# Patient Record
Sex: Male | Born: 1975 | Race: Black or African American | Hispanic: No | Marital: Married | Smoking: Former smoker
Health system: Southern US, Community
[De-identification: ages and names within clinical notes are randomized; demographics above are authoritative.]

## PROBLEM LIST (undated history)

## (undated) DIAGNOSIS — E079 Disorder of thyroid, unspecified: Secondary | ICD-10-CM

## (undated) HISTORY — DX: Disorder of thyroid, unspecified: E07.9

## (undated) HISTORY — PX: CYST EXCISION: SHX5701

---

## 2011-10-13 ENCOUNTER — Ambulatory Visit (INDEPENDENT_AMBULATORY_CARE_PROVIDER_SITE_OTHER): Payer: Managed Care, Other (non HMO) | Admitting: Internal Medicine

## 2011-10-13 ENCOUNTER — Ambulatory Visit: Payer: Managed Care, Other (non HMO)

## 2011-10-13 VITALS — BP 117/75 | HR 78 | Temp 98.4°F | Resp 16 | Ht 67.0 in | Wt 156.0 lb

## 2011-10-13 DIAGNOSIS — Z0000 Encounter for general adult medical examination without abnormal findings: Secondary | ICD-10-CM

## 2011-10-13 DIAGNOSIS — E559 Vitamin D deficiency, unspecified: Secondary | ICD-10-CM

## 2011-10-13 DIAGNOSIS — J40 Bronchitis, not specified as acute or chronic: Secondary | ICD-10-CM

## 2011-10-13 DIAGNOSIS — F17200 Nicotine dependence, unspecified, uncomplicated: Secondary | ICD-10-CM

## 2011-10-13 LAB — POCT CBC
HCT, POC: 44.6 % (ref 43.5–53.7)
Hemoglobin: 14.1 g/dL (ref 14.1–18.1)
Lymph, poc: 4.8 — AB (ref 0.6–3.4)
MCH, POC: 28.3 pg (ref 27–31.2)
MCHC: 31.6 g/dL — AB (ref 31.8–35.4)
MPV: 9.6 fL (ref 0–99.8)
POC MID %: 8.3 %M (ref 0–12)
RBC: 4.98 M/uL (ref 4.69–6.13)
WBC: 9.7 10*3/uL (ref 4.6–10.2)

## 2011-10-13 LAB — POCT URINALYSIS DIPSTICK
Blood, UA: NEGATIVE
Nitrite, UA: NEGATIVE
Protein, UA: NEGATIVE
Spec Grav, UA: 1.015
Urobilinogen, UA: 0.2
pH, UA: 5.5

## 2011-10-13 LAB — IFOBT (OCCULT BLOOD): IFOBT: POSITIVE

## 2011-10-13 LAB — POCT UA - MICROSCOPIC ONLY
Casts, Ur, LPF, POC: NEGATIVE
Crystals, Ur, HPF, POC: NEGATIVE

## 2011-10-13 MED ORDER — AZITHROMYCIN 500 MG PO TABS: 500.0000 mg | ORAL_TABLET | Freq: Every day | ORAL | Status: AC

## 2011-10-13 MED ORDER — HYDROCODONE-ACETAMINOPHEN 7.5-500 MG/15ML PO SOLN: 5.0000 mL | Freq: Four times a day (QID) | ORAL | Status: AC | PRN

## 2011-10-13 NOTE — Patient Instructions (Addendum)
Nicotine Addiction Nicotine can act as both a stimulant (excites/activates) and a sedative (calms/quiets). Immediately after exposure to nicotine, there is a "kick" caused in part by the drug's stimulation of the adrenal glands and resulting discharge of adrenaline (epinephrine). The rush of adrenaline stimulates the body and causes a sudden release of sugar. This means that smokers are always slightly hyperglycemic. Hyperglycemic means that the blood sugar is high, just like in diabetics. Nicotine also decreases the amount of insulin which helps control sugar levels in the body. There is an increase in blood pressure, breathing, and the rate of heart beats.  In addition, nicotine indirectly causes a release of dopamine in the brain that controls pleasure and motivation. A similar reaction is seen with other drugs of abuse, such as cocaine and heroin. This dopamine release is thought to cause the pleasurable sensations when smoking. In some different cases, nicotine can also create a calming effect, depending on sensitivity of the smoker's nervous system and the dose of nicotine taken. WHAT HAPPENS WHEN NICOTINE IS TAKEN FOR LONG PERIODS OF TIME?  Long-term use of nicotine results in addiction. It is difficult to stop.   Repeated use of nicotine creates tolerance. Higher doses of nicotine are needed to get the "kick."  When nicotine use is stopped, withdrawal may last a month or more. Withdrawal may begin within a few hours after the last cigarette. Symptoms peak within the first few days and may lessen within a few weeks. For some people, however, symptoms may last for months or longer. Withdrawal symptoms include:   Irritability.   Craving.   Learning and attention deficits.   Sleep disturbances.   Increased appetite.  Craving for tobacco may last for 6 months or longer. Many behaviors done while using nicotine can also play a part in the severity of withdrawal symptoms. For some people, the  feel, smell, and sight of a cigarette and the ritual of obtaining, handling, lighting, and smoking the cigarette are closely linked with the pleasure of smoking. When stopped, they also miss the related behaviors which make the withdrawal or craving worse. While nicotine gum and patches may lessen the drug aspects of withdrawal, cravings often persist. WHAT ARE THE MEDICAL CONSEQUENCES OF NICOTINE USE?  Nicotine addiction accounts for one-third of all cancers. The top cancer caused by tobacco is lung cancer. Lung cancer is the number one cancer killer of both men and women.   Smoking is also associated with cancers of the:   Mouth.   Pharynx.   Larynx.   Esophagus.   Stomach.   Pancreas.   Cervix.   Kidney.   Ureter.   Bladder.   Smoking also causes lung diseases such as lasting (chronic) bronchitis and emphysema.   It worsens asthma in adults and children.   Smoking increases the risk of heart disease, including:   Stroke.   Heart attack.   Vascular disease.   Aneurysm.   Passive or secondary smoke can also increase medical risks including:   Asthma in children.   Sudden Infant Death Syndrome (SIDS).   Additionally, dropped cigarettes are the leading cause of residential fire fatalities.   Nicotine poisoning has been reported from accidental ingestion of tobacco products by children and pets. Death usually results in a few minutes from respiratory failure (when a person stops breathing) caused by paralysis.  TREATMENT   Medication. Nicotine replacement medicines such as nicotine gum and the patch are used to stop smoking. These medicines gradually lower the dosage   of nicotine in the body. These medicines do not contain the carbon monoxide and other toxins found in tobacco smoke.   Hypnotherapy.   Relaxation therapy.   Nicotine Anonymous (a 12-step support program). Find times and locations in your local yellow pages.  Document Released: 12/18/2003 Document  Revised: 04/02/2011 Document Reviewed: 05/11/2007 [facility] Patient Information 2012 [place], [place].Bronchitis Bronchitis is the body's way of reacting to injury and/or infection (inflammation) of the bronchi. Bronchi are the air tubes that extend from the windpipe into the lungs. If the inflammation becomes severe, it may cause shortness of breath. CAUSES  Inflammation may be caused by:  A virus.   Germs (bacteria).   Dust.   Allergens.   Pollutants and many other irritants.  The cells lining the bronchial tree are covered with tiny hairs (cilia). These constantly beat upward, away from the lungs, toward the mouth. This keeps the lungs free of pollutants. When these cells become too irritated and are unable to do their job, mucus begins to develop. This causes the characteristic cough of bronchitis. The cough clears the lungs when the cilia are unable to do their job. Without either of these protective mechanisms, the mucus would settle in the lungs. Then you would develop pneumonia. Smoking is a common cause of bronchitis and can contribute to pneumonia. Stopping this habit is the single most important thing you can do to help yourself. TREATMENT   Your caregiver may prescribe an antibiotic if the cough is caused by bacteria. Also, medicines that open up your airways make it easier to breathe. Your caregiver may also recommend or prescribe an expectorant. It will loosen the mucus to be coughed up. Only take over-the-counter or prescription medicines for pain, discomfort, or fever as directed by your caregiver.   Removing whatever causes the problem (smoking, for example) is critical to preventing the problem from getting worse.   Cough suppressants may be prescribed for relief of cough symptoms.   Inhaled medicines may be prescribed to help with symptoms now and to help prevent problems from returning.   For those with recurrent (chronic) bronchitis, there may be a need for steroid  medicines.  SEEK IMMEDIATE MEDICAL CARE IF:   During treatment, you develop more pus-like mucus (purulent sputum).   You have a fever.   Your baby is older than 3 months with a rectal temperature of 102 F (38.9 C) or higher.   Your baby is 55 months old or younger with a rectal temperature of 100.4 F (38 C) or higher.   You become progressively more ill.   You have increased difficulty breathing, wheezing, or shortness of breath.  It is necessary to seek immediate medical care if you are elderly or sick from any other disease. MAKE SURE YOU:   Understand these instructions.   Will watch your condition.   Will get help right away if you are not doing well or get worse.  Document Released: 04/13/2005 Document Revised: 04/02/2011 Document Reviewed: 02/21/2008 [facility] Patient Information 2012 [place], [place].

## 2011-10-13 NOTE — Progress Notes (Signed)
Subjective:    Patient ID: Jonathon Harris, male    DOB: [birth year], 36 y.o.   MRN: [id number]  HPI Has bronchitis, wants a cpe, smoker with fhx of cad, has 2 skin problems. See scanned hx   Review of Systems See scanned ros    Objective:   Physical Exam  Constitutional: He is oriented to person, place, and time. He appears well-developed and well-nourished.  HENT:  Right Ear: External ear normal.  Left Ear: External ear normal.  Nose: Nose normal.  Mouth/Throat: Oropharynx is clear and moist.  Eyes: Conjunctivae and EOM are normal. Pupils are equal, round, and reactive to light.  Neck: Normal range of motion. Neck supple. No thyromegaly present.  Cardiovascular: Normal rate, regular rhythm and normal heart sounds.   Pulmonary/Chest: Effort normal. Not tachypneic. No respiratory distress. He has no decreased breath sounds. He has rhonchi.  Abdominal: Soft. Bowel sounds are normal. He exhibits no mass. There is no tenderness.  Genitourinary: Rectum normal, prostate normal and penis normal.  Musculoskeletal: Normal range of motion.  Lymphadenopathy:    He has no cervical adenopathy.  Neurological: He is alert and oriented to person, place, and time. He has normal reflexes. He displays normal reflexes. No cranial nerve deficit. He exhibits normal muscle tone. Coordination normal.  Skin: Skin is warm and dry.  Psychiatric: He has a normal mood and affect. His behavior is normal. Judgment and thought content normal.   [facility] reading (PRIMARY) by  Dr.Jaymarion Trombly cxr nl.  ekg nl Results for orders placed in visit on 10/13/11  POCT URINALYSIS DIPSTICK      Component Value Range   Color, UA yellow     Clarity, UA clear     Glucose, UA neg     Bilirubin, UA neg     Ketones, UA neg     Spec Grav, UA 1.015     Blood, UA neg     pH, UA 5.5     Protein, UA neg     Urobilinogen, UA 0.2     Nitrite, UA neg     Leukocytes, UA Negative    POCT UA - MICROSCOPIC ONLY      Component Value Range    WBC, Ur, HPF, POC 1-3     RBC, urine, microscopic 0-2     Bacteria, U Microscopic small     Mucus, UA neg     Epithelial cells, urine per micros 1-3     Crystals, Ur, HPF, POC neg     Casts, Ur, LPF, POC neg     Yeast, UA neg    POCT CBC      Component Value Range   WBC 9.7  4.6 - 10.2 K/uL   Lymph, poc 4.8 (*) 0.6 - 3.4   POC LYMPH PERCENT 49.1  10 - 50 %L   MID (cbc) 0.8  0 - 0.9   POC MID % 8.3  0 - 12 %M   POC Granulocyte 4.1  2 - 6.9   Granulocyte percent 42.6  37 - 80 %G   RBC 4.98  4.69 - 6.13 M/uL   Hemoglobin 14.1  14.1 - 18.1 g/dL   HCT, POC 40.9  81.1 - 53.7 %   MCV 89.5  80 - 97 fL   MCH, POC 28.3  27 - 31.2 pg   MCHC 31.6 (*) 31.8 - 35.4 g/dL   RDW, POC 91.4     Platelet Count, POC 206  142 - 424 K/uL  MPV 9.6  0 - 99.8 fL  IFOBT (OCCULT BLOOD)      Component Value Range   IFOBT Positive            Assessment & Plan:  See Dr. Jorja Loa Quit smoking Bronchitis

## 2011-10-14 LAB — COMPREHENSIVE METABOLIC PANEL
ALT: 11 U/L (ref 0–53)
AST: 13 U/L (ref 0–37)
Albumin: 4.1 g/dL (ref 3.5–5.2)
BUN: 9 mg/dL (ref 6–23)
CO2: 27 mEq/L (ref 19–32)
Calcium: 8.9 mg/dL (ref 8.4–10.5)
Chloride: 108 mEq/L (ref 96–112)
Creat: 0.93 mg/dL (ref 0.50–1.35)
Potassium: 4.3 mEq/L (ref 3.5–5.3)

## 2011-10-14 LAB — LIPID PANEL: LDL Cholesterol: 99 mg/dL (ref 0–99)

## 2011-10-15 LAB — TSH: TSH: 0.249 u[IU]/mL — ABNORMAL LOW (ref 0.350–4.500)

## 2011-10-15 LAB — VITAMIN D 25 HYDROXY (VIT D DEFICIENCY, FRACTURES): Vit D, 25-Hydroxy: 15 ng/mL — ABNORMAL LOW (ref 30–89)

## 2011-10-17 ENCOUNTER — Encounter: Payer: Self-pay | Admitting: Family Medicine

## 2011-10-21 ENCOUNTER — Encounter: Payer: Self-pay | Admitting: Internal Medicine

## 2013-09-08 IMAGING — CR DG CHEST 2V
2 series · 2 of 2 positions shown · non-contrast
Comparison: Preliminary reading of Dr. Saddler.

CLINICAL DATA: Chest pain

CHEST - 2 VIEW

[PA]
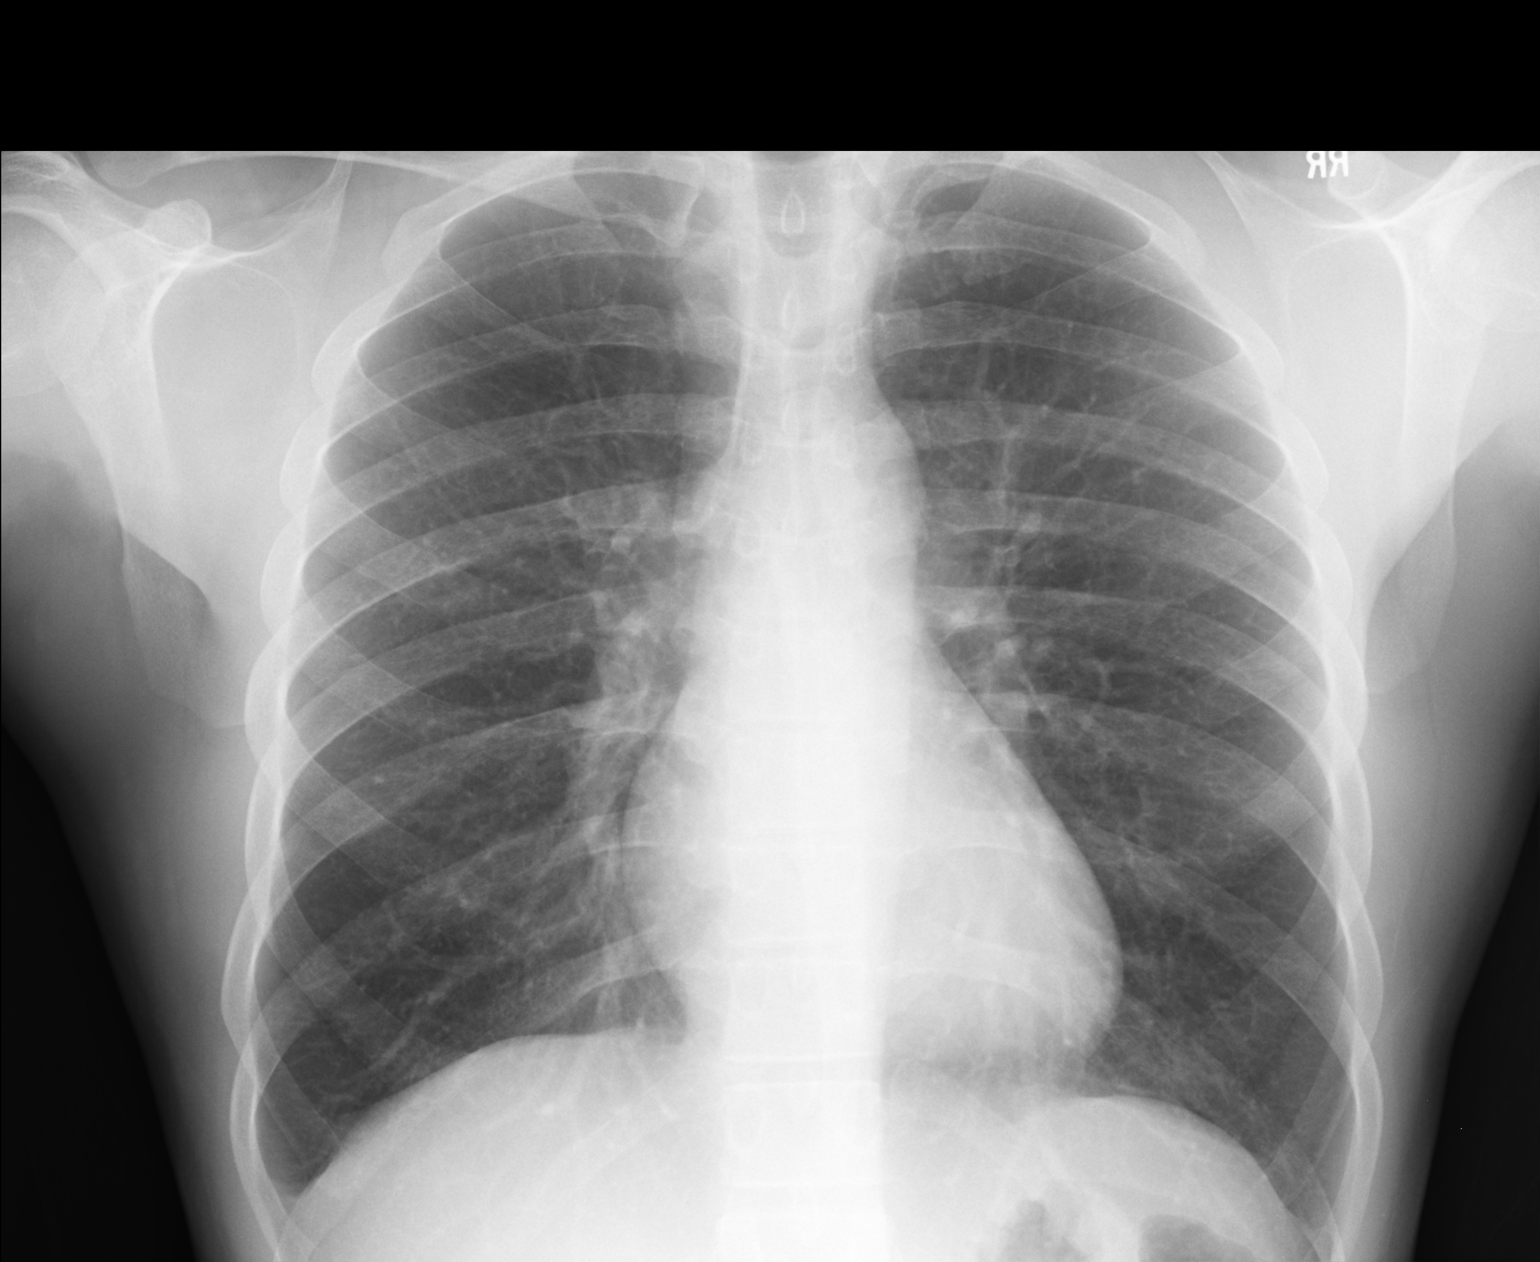

[lateral]
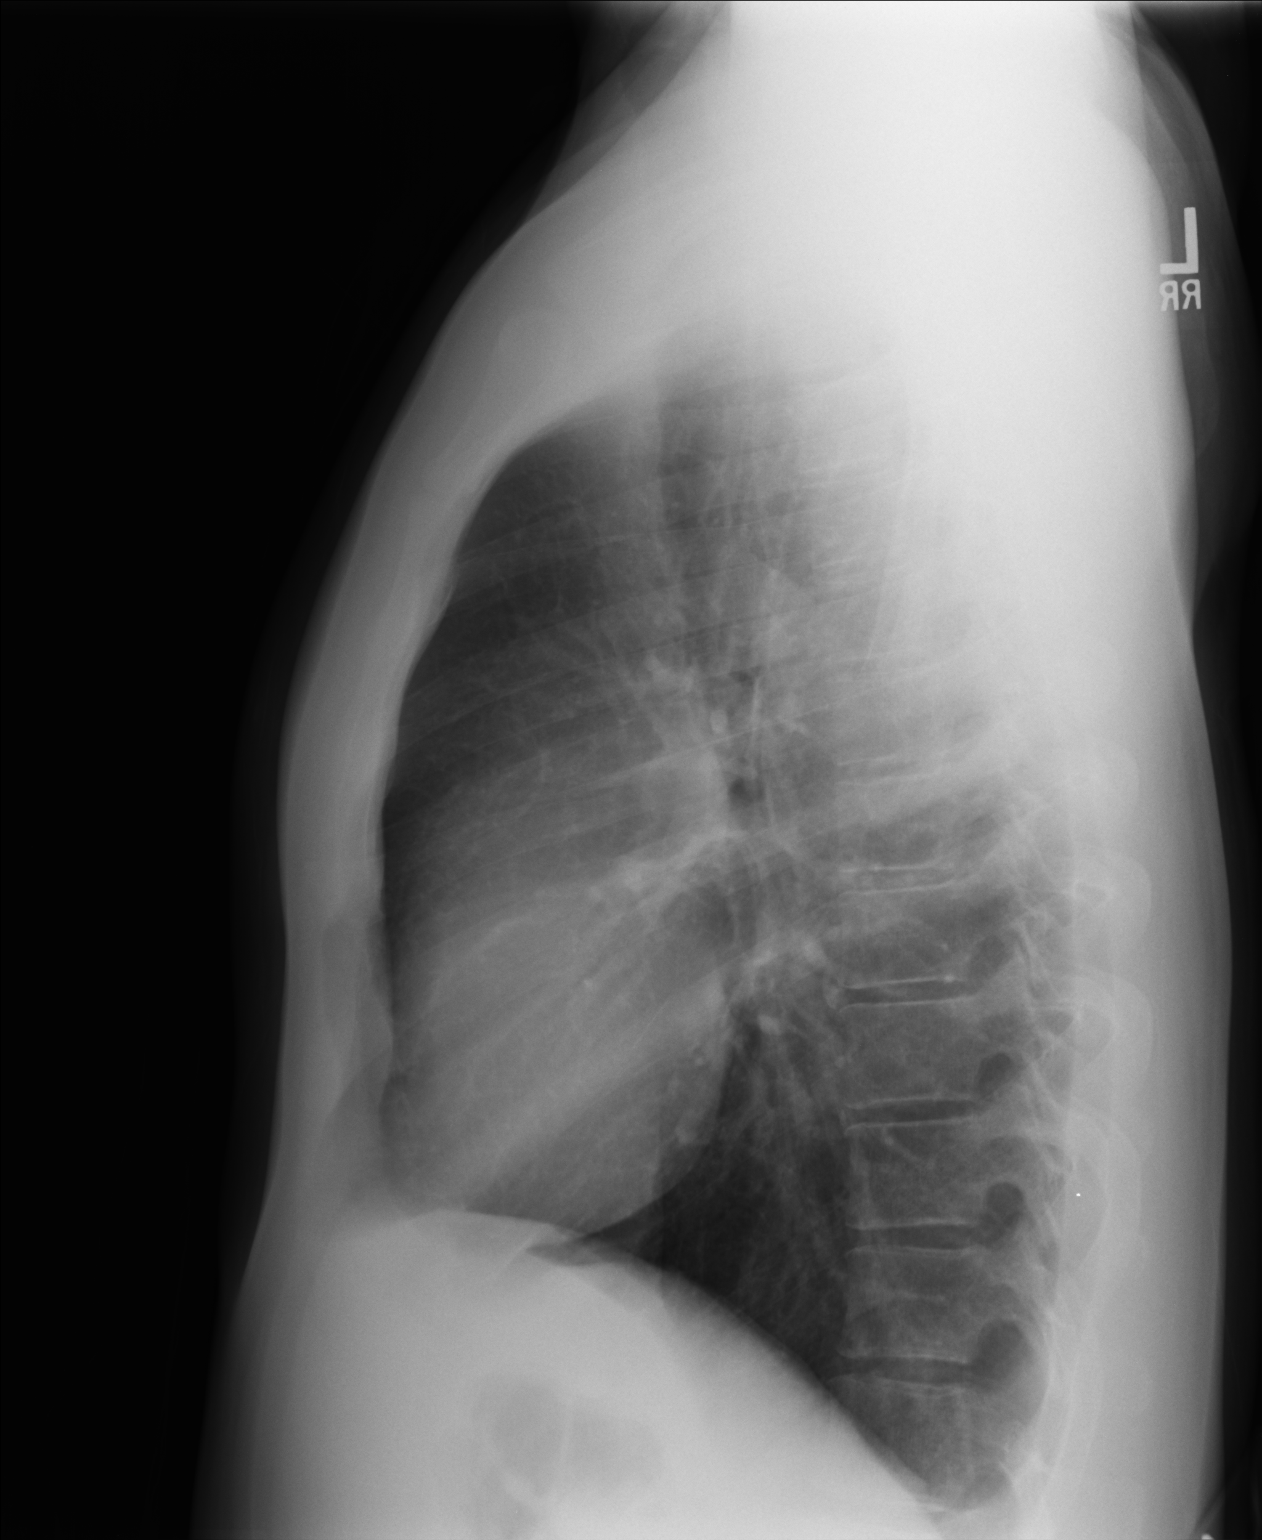

[2 of 2 positions shown; findings below may reference images not displayed]

FINDINGS: Cardiomediastinal silhouette is unremarkable.  No acute
infiltrate or pleural effusion.  No pulmonary edema.  Bony thorax
is unremarkable.
IMPRESSION: No active disease.

Clinically significant discrepancy from primary report, if
provided: None

## 2016-02-10 ENCOUNTER — Encounter: Payer: Self-pay | Admitting: Nurse Practitioner

## 2016-02-10 ENCOUNTER — Ambulatory Visit (INDEPENDENT_AMBULATORY_CARE_PROVIDER_SITE_OTHER): Payer: BLUE CROSS/BLUE SHIELD | Admitting: Nurse Practitioner

## 2016-02-10 ENCOUNTER — Other Ambulatory Visit (INDEPENDENT_AMBULATORY_CARE_PROVIDER_SITE_OTHER): Payer: BLUE CROSS/BLUE SHIELD

## 2016-02-10 VITALS — BP 142/86 | Temp 98.2°F | Ht 67.0 in | Wt 181.0 lb

## 2016-02-10 DIAGNOSIS — Z0000 Encounter for general adult medical examination without abnormal findings: Secondary | ICD-10-CM | POA: Diagnosis not present

## 2016-02-10 DIAGNOSIS — E559 Vitamin D deficiency, unspecified: Secondary | ICD-10-CM

## 2016-02-10 DIAGNOSIS — E785 Hyperlipidemia, unspecified: Secondary | ICD-10-CM

## 2016-02-10 DIAGNOSIS — R030 Elevated blood-pressure reading, without diagnosis of hypertension: Secondary | ICD-10-CM | POA: Diagnosis not present

## 2016-02-10 DIAGNOSIS — Z23 Encounter for immunization: Secondary | ICD-10-CM

## 2016-02-10 DIAGNOSIS — M549 Dorsalgia, unspecified: Secondary | ICD-10-CM

## 2016-02-10 DIAGNOSIS — G8929 Other chronic pain: Secondary | ICD-10-CM | POA: Insufficient documentation

## 2016-02-10 LAB — LIPID PANEL
CHOL/HDL RATIO: 5
CHOLESTEROL: 173 mg/dL (ref 0–200)
HDL: 32.4 mg/dL — ABNORMAL LOW (ref >39.00)
NonHDL: 140.57
TRIGLYCERIDES: 249 mg/dL — AB (ref 0.0–149.0)
VLDL: 49.8 mg/dL — ABNORMAL HIGH (ref 0.0–40.0)

## 2016-02-10 LAB — COMPREHENSIVE METABOLIC PANEL
ALBUMIN: 4.1 g/dL (ref 3.5–5.2)
ALK PHOS: 61 U/L (ref 39–117)
ALT: 14 U/L (ref 0–53)
AST: 14 U/L (ref 0–37)
BILIRUBIN TOTAL: 0.4 mg/dL (ref 0.2–1.2)
BUN: 9 mg/dL (ref 6–23)
CALCIUM: 9 mg/dL (ref 8.4–10.5)
CO2: 29 mEq/L (ref 19–32)
Chloride: 106 mEq/L (ref 96–112)
Creatinine, Ser: 1.08 mg/dL (ref 0.40–1.50)
GFR: 96.74 mL/min (ref >60.00)
Glucose, Bld: 104 mg/dL — ABNORMAL HIGH (ref 70–99)
Potassium: 4.2 mEq/L (ref 3.5–5.1)
Sodium: 141 mEq/L (ref 135–145)
TOTAL PROTEIN: 6.7 g/dL (ref 6.0–8.3)

## 2016-02-10 LAB — LDL CHOLESTEROL, DIRECT: Direct LDL: 112 mg/dL

## 2016-02-10 NOTE — Progress Notes (Signed)
Pre visit review using our clinic review tool, if applicable. No additional management support is needed unless otherwise documented below in the visit note. 

## 2016-02-10 NOTE — Progress Notes (Signed)
Subjective:    Patient ID: Jonathon Harris, male    DOB: [birth year], 40 y.o.   MRN: [id number]  Patient presents today for complete physical  HPI He denies any acute complaint today. Last seen by pcp 2016: urgent care on [address]  Immunizations: (TDAP, Hep C screen, Pneumovax, Influenza, zoster)  Health Maintenance  Topic Date Due  . Tetanus Vaccine  10/10/1993  . Flu Shot  07/25/2016*  . HIV Screening  01/25/2017*  *Topic was postponed. The date shown is not the original due date.   Diet:healthy Weight:  Wt Readings from Last 3 Encounters:  02/10/16 181 lb (82.1 kg)  10/13/11 156 lb (70.8 kg)   Exercise:none Fall Risk: Fall Risk  02/10/2016  Falls in the past year? No   Home Safety:married, home with wife Depression/Suicide: Depression screen [facility] 2/9 02/10/2016  Decreased Interest 0  Down, Depressed, Hopeless 0  PHQ - 2 Score 0   Vision:patient will schedule Dental:patient will schedule Advanced Directive: Advanced Directives 02/10/2016  Does patient have an advance directive? No  Would patient like information on creating an advanced directive? No - patient declined information   Medications and allergies reviewed with patient and updated if appropriate.  Patient Active Problem List   Diagnosis Date Noted  . Chronic bilateral back pain 02/10/2016    Current Outpatient Prescriptions on File Prior to Visit  Medication Sig Dispense Refill  . fish oil-omega-3 fatty acids 1000 MG capsule Take 1 g by mouth daily.     No current facility-administered medications on file prior to visit.     History reviewed. No pertinent past medical history.  History reviewed. No pertinent surgical history.  Social History   Social History  . Marital status: Single    Spouse name: N/A  . Number of children: N/A  . Years of education: N/A   Social History Main Topics  . Smoking status: Current Every Day Smoker    Packs/day: 0.50    Types: Cigarettes  . Smokeless  tobacco: Current User  . Alcohol use 2.4 oz/week    4 Shots of liquor per week  . Drug use:     Frequency: 1.0 time per week    Types: Marijuana  . Sexual activity: Yes   Other Topics Concern  . None   Social History Narrative  . None    Family History  Problem Relation Age of Onset  . Hypertension Father   . Cerebral aneurysm Father   . Heart disease Father   . Stroke Brother   . Heart disease Brother   . Cancer Maternal Aunt   . Cancer Maternal Uncle   . Asthma Maternal Grandmother   . Asthma Maternal Grandfather         Review of Systems  Constitutional: Negative for fever, malaise/fatigue and weight loss.  HENT: Negative for congestion and sore throat.   Eyes:       Negative for visual changes  Respiratory: Negative for cough and shortness of breath.   Cardiovascular: Negative for chest pain, palpitations and leg swelling.  Gastrointestinal: Negative for blood in stool, constipation, diarrhea and heartburn.  Genitourinary: Negative for dysuria, frequency and urgency.  Musculoskeletal: Positive for back pain and joint pain. Negative for falls, myalgias and neck pain.       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 Uses marijuana to manage pain.  Chronic intermittent left shoulder pain radiate down left arm, worse with certain arm position,  improves with change in shoulder and arm position. No weakness, occassional arm numbness with improves with reposition of arm and shoulder.  Skin: Negative for itching and rash.  Neurological: Negative for dizziness, sensory change and headaches.  Endo/Heme/Allergies: Does not bruise/bleed easily.  Psychiatric/Behavioral: Negative for depression, substance abuse and suicidal ideas. The patient is not nervous/anxious.     Objective:   Vitals:   02/10/16 0915  BP: (!) 142/86  Temp: 98.2 F (36.8 C)    Body mass index is 28.35 kg/m.   Physical Examination:  Physical  Exam  Constitutional: He is oriented to person, place, and time and well-developed, well-nourished, and in no distress. No distress.  HENT:  Right Ear: External ear normal.  Left Ear: External ear normal.  Nose: Nose normal.  Mouth/Throat: Oropharynx is clear and moist. No oropharyngeal exudate.  Eyes: Conjunctivae and EOM are normal. Pupils are equal, round, and reactive to light. No scleral icterus.  Neck: Normal range of motion. Neck supple. No thyromegaly present.  Cardiovascular: Normal rate, normal heart sounds and intact distal pulses.   Pulmonary/Chest: Effort normal and breath sounds normal. No respiratory distress. He exhibits no tenderness.  Abdominal: Soft. Bowel sounds are normal. He exhibits no distension. There is no tenderness.  Musculoskeletal: Normal range of motion. He exhibits no edema or tenderness.  Lymphadenopathy:    He has no cervical adenopathy.  Neurological: He is alert and oriented to person, place, and time. Gait normal.  Skin: Skin is warm and dry.  Psychiatric: Affect and judgment normal.  Vitals reviewed.   ASSESSMENT and PLAN:  Niranjan was seen today for annual exam.  Diagnoses and all orders for this visit:  Encounter for preventative adult health care examination -     Comprehensive metabolic panel  Hyperlipidemia, unspecified hyperlipidemia type -     Lipid panel; Future  Vitamin D deficiency -     Vitamin D 1,25 dihydroxy; Future  Elevated BP without diagnosis of hypertension  Need for prophylactic vaccination with combined diphtheria-tetanus-pertussis (DTP) vaccine -     Tdap vaccine greater than or equal to 7yo IM    No problem-specific Assessment & Plan notes found for this encounter.      Follow up: Return in about 3 months (around 05/12/2016) for hyperlipidemia and BP.  Wilfred Lacy, NP

## 2016-02-10 NOTE — Patient Instructions (Addendum)
Encourage to quit tobacco use. maintain heart healthy diet and regular exercise. Obtain medical records from Urgent Care clinic on [address]

## 2016-02-11 ENCOUNTER — Other Ambulatory Visit: Payer: Self-pay | Admitting: *Deleted

## 2016-02-11 MED ORDER — PRAVASTATIN SODIUM 40 MG PO TABS: 40.0000 mg | ORAL_TABLET | Freq: Every day | ORAL | 3 refills | Status: DC

## 2016-02-11 NOTE — Telephone Encounter (Signed)
See recent labs- Per Wilfred Lacy, NP pt needs new rx for Pravastatin 40 mg. See meds.

## 2016-02-11 NOTE — Progress Notes (Signed)
Abnormal Lipid panel indicates persistent elevation of triglycerides and LDL. Lipid panel also indicates a low HDL. HDL can be improved with regular exercise. For LDL and triglyceride,i recommend use of cholesterol-lowering medication in addition to fish oil supplement. If patient agrees, Send prescription of pravastatin 40 mg once a day at bedtime,#30, 3 refills. Normal kidney and liver function.

## 2016-02-14 LAB — VITAMIN D 1,25 DIHYDROXY
VITAMIN D 1, 25 (OH) TOTAL: 44 pg/mL (ref 18–72)
Vitamin D2 1, 25 (OH)2: <8 pg/mL
Vitamin D3 1, 25 (OH)2: 44 pg/mL

## 2016-02-17 NOTE — Progress Notes (Signed)
Normal results,

## 2016-03-16 ENCOUNTER — Other Ambulatory Visit: Payer: Self-pay

## 2016-03-16 ENCOUNTER — Telehealth: Payer: Self-pay | Admitting: Nurse Practitioner

## 2016-03-16 MED ORDER — PRAVASTATIN SODIUM 40 MG PO TABS: 40.0000 mg | ORAL_TABLET | Freq: Every day | ORAL | 1 refills | Status: DC

## 2016-03-16 NOTE — Telephone Encounter (Signed)
Pt called in and said that the pravastatin (PRAVACHOL) 40 MG tablet [id number]  [facility] per pt ins.

## 2016-03-16 NOTE — Telephone Encounter (Signed)
[facility]

## 2016-05-12 ENCOUNTER — Ambulatory Visit (INDEPENDENT_AMBULATORY_CARE_PROVIDER_SITE_OTHER): Payer: BLUE CROSS/BLUE SHIELD | Admitting: Nurse Practitioner

## 2016-05-12 ENCOUNTER — Other Ambulatory Visit (INDEPENDENT_AMBULATORY_CARE_PROVIDER_SITE_OTHER): Payer: BLUE CROSS/BLUE SHIELD

## 2016-05-12 ENCOUNTER — Encounter: Payer: Self-pay | Admitting: Nurse Practitioner

## 2016-05-12 VITALS — BP 144/92 | HR 70 | Temp 98.8°F | Ht 67.0 in | Wt 176.0 lb

## 2016-05-12 DIAGNOSIS — E782 Mixed hyperlipidemia: Secondary | ICD-10-CM

## 2016-05-12 DIAGNOSIS — R030 Elevated blood-pressure reading, without diagnosis of hypertension: Secondary | ICD-10-CM

## 2016-05-12 DIAGNOSIS — E785 Hyperlipidemia, unspecified: Secondary | ICD-10-CM | POA: Insufficient documentation

## 2016-05-12 LAB — LIPID PANEL
CHOL/HDL RATIO: 5
CHOLESTEROL: 181 mg/dL (ref 0–200)
HDL: 39.5 mg/dL (ref >39.00)
LDL CALC: 111 mg/dL — AB (ref 0–99)
NonHDL: 141.57
TRIGLYCERIDES: 152 mg/dL — AB (ref 0.0–149.0)
VLDL: 30.4 mg/dL (ref 0.0–40.0)

## 2016-05-12 MED ORDER — OMEGA-3 1000 MG PO CAPS: 1.0000 | ORAL_CAPSULE | Freq: Two times a day (BID) | ORAL | 1 refills | Status: AC

## 2016-05-12 MED ORDER — PRAVASTATIN SODIUM 40 MG PO TABS: 40.0000 mg | ORAL_TABLET | Freq: Every day | ORAL | 1 refills | Status: DC

## 2016-05-12 NOTE — Patient Instructions (Signed)
Hypertension Hypertension, commonly called high blood pressure, is when the force of blood pumping through your arteries is too strong. Your arteries are the blood vessels that carry blood from your heart throughout your body. A blood pressure reading consists of a higher number over a lower number, such as 110/72. The higher number (systolic) is the pressure inside your arteries when your heart pumps. The lower number (diastolic) is the pressure inside your arteries when your heart relaxes. Ideally you want your blood pressure below 120/80. Hypertension forces your heart to work harder to pump blood. Your arteries may become narrow or stiff. Having untreated or uncontrolled hypertension can cause heart attack, stroke, kidney disease, and other problems. What increases the risk? Some risk factors for high blood pressure are controllable. Others are not. Risk factors you cannot control include:  Race. You may be at higher risk if you are African American.  Age. Risk increases with age.  Gender. Men are at higher risk than women before age 45 years. After age 65, women are at higher risk than men. Risk factors you can control include:  Not getting enough exercise or physical activity.  Being overweight.  Getting too much fat, sugar, calories, or salt in your diet.  Drinking too much alcohol. What are the signs or symptoms? Hypertension does not usually cause signs or symptoms. Extremely high blood pressure (hypertensive crisis) may cause headache, anxiety, shortness of breath, and nosebleed. How is this diagnosed? To check if you have hypertension, your health care provider will measure your blood pressure while you are seated, with your arm held at the level of your heart. It should be measured at least twice using the same arm. Certain conditions can cause a difference in blood pressure between your right and left arms. A blood pressure reading that is higher than normal on one occasion does  not mean that you need treatment. If it is not clear whether you have high blood pressure, you may be asked to return on a different day to have your blood pressure checked again. Or, you may be asked to monitor your blood pressure at home for 1 or more weeks. How is this treated? Treating high blood pressure includes making lifestyle changes and possibly taking medicine. Living a healthy lifestyle can help lower high blood pressure. You may need to change some of your habits. Lifestyle changes may include:  Following the DASH diet. This diet is high in fruits, vegetables, and whole grains. It is low in salt, red meat, and added sugars.  Keep your sodium intake below 2,300 mg per day.  Getting at least 30-45 minutes of aerobic exercise at least 4 times per week.  Losing weight if necessary.  Not smoking.  Limiting alcoholic beverages.  Learning ways to reduce stress. Your health care provider may prescribe medicine if lifestyle changes are not enough to get your blood pressure under control, and if one of the following is true:  You are 18-59 years of age and your systolic blood pressure is above 140.  You are 60 years of age or older, and your systolic blood pressure is above 150.  Your diastolic blood pressure is above 90.  You have diabetes, and your systolic blood pressure is over 140 or your diastolic blood pressure is over 90.  You have kidney disease and your blood pressure is above 140/90.  You have heart disease and your blood pressure is above 140/90. Your personal target blood pressure may vary depending on your medical   conditions, your age, and other factors. Follow these instructions at home:  Have your blood pressure rechecked as directed by your health care provider.  Take medicines only as directed by your health care provider. Follow the directions carefully. Blood pressure medicines must be taken as prescribed. The medicine does not work as well when you skip  doses. Skipping doses also puts you at risk for problems.  Do not smoke.  Monitor your blood pressure at home as directed by your health care provider. Contact a health care provider if:  You think you are having a reaction to medicines taken.  You have recurrent headaches or feel dizzy.  You have swelling in your ankles.  You have trouble with your vision. Get help right away if:  You develop a severe headache or confusion.  You have unusual weakness, numbness, or feel faint.  You have severe chest or abdominal pain.  You vomit repeatedly.  You have trouble breathing. This information is not intended to replace advice given to you by your health care provider. Make sure you discuss any questions you have with your health care provider. Document Released: 04/13/2005 Document Revised: 09/19/2015 Document Reviewed: 02/03/2013 [employer] Interactive Patient Education  2017 [employer]  

## 2016-05-12 NOTE — Progress Notes (Signed)
Subjective:  Patient ID: Jonathon Harris, male    DOB: 1976/02/27  Age: 42 y.o. MRN: [id number]  CC: Follow-up (3 mo fu/choresterol med)   HPI   HTN: Has changed diet to heart healthy diet and has decrease number of cigarette use. He is using smoking cessation through his insurance company. He does not have stop date for tobacco use.  Hyperlipidemia:  denies any adverse effects with pravastatin and omega 3 fatty acid.  Outpatient Medications Prior to Visit  Medication Sig Dispense Refill  . Cholecalciferol (VITAMIN D PO) Take by mouth.    . fish oil-omega-3 fatty acids 1000 MG capsule Take 1 g by mouth daily.    . pravastatin (PRAVACHOL) 40 MG tablet Take 1 tablet (40 mg total) by mouth at bedtime. 90 tablet 1   No facility-administered medications prior to visit.     ROS Review of Systems  Constitutional: Negative for fever, malaise/fatigue and weight loss.  HENT: Negative for congestion and sore throat.   Eyes:       Negative for visual changes  Respiratory: Negative for cough and shortness of breath.   Cardiovascular: Negative for chest pain, palpitations and leg swelling.  Gastrointestinal: Negative for blood in stool, constipation, diarrhea and heartburn.  Genitourinary: Negative for dysuria, frequency and urgency.  Musculoskeletal: Negative for falls, joint pain and myalgias.  Skin: Negative for rash.  Neurological: Negative for dizziness, sensory change and headaches.  Endo/Heme/Allergies: Does not bruise/bleed easily.  Psychiatric/Behavioral: Negative for depression, substance abuse and suicidal ideas. The patient is not nervous/anxious.     Objective:  BP (!) 144/92   Pulse 70   Temp 98.8 F (37.1 C)   Ht 5\' 7"  (1.702 m)   Wt 176 lb (79.8 kg)   SpO2 98%   BMI 27.57 kg/m   BP Readings from Last 3 Encounters:  05/12/16 (!) 144/92  02/10/16 (!) 142/86  10/13/11 117/75    Wt Readings from Last 3 Encounters:  05/12/16 176 lb (79.8 kg)  02/10/16 181 lb  (82.1 kg)  10/13/11 156 lb (70.8 kg)    Physical Exam  Constitutional: He is oriented to person, place, and time. No distress.  Neck: Normal range of motion. Neck supple.  Cardiovascular: Normal rate, regular rhythm and normal heart sounds.   Pulmonary/Chest: Effort normal and breath sounds normal.  Musculoskeletal: He exhibits no edema.  Neurological: He is alert and oriented to person, place, and time.  Vitals reviewed.   Lab Results  Component Value Date   WBC 9.7 10/13/2011   HGB 14.1 10/13/2011   HCT 44.6 10/13/2011   GLUCOSE 104 (H) 02/10/2016   CHOL 181 05/12/2016   TRIG 152.0 (H) 05/12/2016   HDL 39.50 05/12/2016   LDLDIRECT 112.0 02/10/2016   LDLCALC 111 (H) 05/12/2016   ALT 14 02/10/2016   AST 14 02/10/2016   NA 141 02/10/2016   K 4.2 02/10/2016   CL 106 02/10/2016   CREATININE 1.08 02/10/2016   BUN 9 02/10/2016   CO2 29 02/10/2016   TSH 0.249 (L) 10/13/2011    Patient was never admitted.  Assessment & Plan:   Jonathon Harris was seen today for follow-up.  Diagnoses and all orders for this visit:  Elevated BP without diagnosis of hypertension  Mixed hyperlipidemia -     Lipid panel; Future -     Omega-3 1000 MG CAPS; Take 1 capsule (1,000 mg total) by mouth 2 (two) times daily after a meal. -     pravastatin (PRAVACHOL)  40 MG tablet; Take 1 tablet (40 mg total) by mouth at bedtime.   I have discontinued Jonathon Harris fish oil-omega-3 fatty acids. I am also having him start on Omega-3. Additionally, I am having him maintain his Cholecalciferol (VITAMIN D PO) and pravastatin.  Meds ordered this encounter  Medications  . Omega-3 1000 MG CAPS    Sig: Take 1 capsule (1,000 mg total) by mouth 2 (two) times daily after a meal.    Dispense:  180 capsule    Refill:  1    Order Specific Question:   Supervising Provider    Answer:   Cassandria Anger [[id number]]  . pravastatin (PRAVACHOL) 40 MG tablet    Sig: Take 1 tablet (40 mg total) by mouth at bedtime.     Dispense:  90 tablet    Refill:  1    Order Specific Question:   Supervising Provider    Answer:   Cassandria Anger [[id number]]    Follow-up: Return in about 6 months (around 11/09/2016) for hyperlipidemia, elevated BP (fasting, CPE?) 67monis appt.  Wilfred Lacy, NP

## 2016-05-12 NOTE — Progress Notes (Signed)
Pre visit review using our clinic review tool, if applicable. No additional management support is needed unless otherwise documented below in the visit note. 

## 2016-05-14 DIAGNOSIS — R030 Elevated blood-pressure reading, without diagnosis of hypertension: Secondary | ICD-10-CM

## 2016-05-14 HISTORY — DX: Elevated blood-pressure reading, without diagnosis of hypertension: R03.0

## 2016-11-09 ENCOUNTER — Encounter: Payer: Self-pay | Admitting: Nurse Practitioner

## 2016-11-09 ENCOUNTER — Ambulatory Visit (INDEPENDENT_AMBULATORY_CARE_PROVIDER_SITE_OTHER): Payer: BLUE CROSS/BLUE SHIELD | Admitting: Nurse Practitioner

## 2016-11-09 ENCOUNTER — Other Ambulatory Visit (INDEPENDENT_AMBULATORY_CARE_PROVIDER_SITE_OTHER): Payer: BLUE CROSS/BLUE SHIELD

## 2016-11-09 VITALS — BP 110/70 | HR 71 | Temp 98.5°F | Ht 67.0 in | Wt 175.0 lb

## 2016-11-09 DIAGNOSIS — E559 Vitamin D deficiency, unspecified: Secondary | ICD-10-CM

## 2016-11-09 DIAGNOSIS — Z0001 Encounter for general adult medical examination with abnormal findings: Secondary | ICD-10-CM

## 2016-11-09 DIAGNOSIS — Z1211 Encounter for screening for malignant neoplasm of colon: Secondary | ICD-10-CM | POA: Diagnosis not present

## 2016-11-09 DIAGNOSIS — Z13220 Encounter for screening for lipoid disorders: Secondary | ICD-10-CM

## 2016-11-09 DIAGNOSIS — Z136 Encounter for screening for cardiovascular disorders: Secondary | ICD-10-CM | POA: Diagnosis not present

## 2016-11-09 DIAGNOSIS — E782 Mixed hyperlipidemia: Secondary | ICD-10-CM

## 2016-11-09 DIAGNOSIS — B353 Tinea pedis: Secondary | ICD-10-CM

## 2016-11-09 DIAGNOSIS — M7712 Lateral epicondylitis, left elbow: Secondary | ICD-10-CM | POA: Diagnosis not present

## 2016-11-09 LAB — CBC
HEMATOCRIT: 45 % (ref 39.0–52.0)
Hemoglobin: 15 g/dL (ref 13.0–17.0)
MCHC: 33.3 g/dL (ref 30.0–36.0)
MCV: 89.3 fl (ref 78.0–100.0)
Platelets: 215 10*3/uL (ref 150.0–400.0)
RBC: 5.04 Mil/uL (ref 4.22–5.81)
RDW: 14.5 % (ref 11.5–15.5)
WBC: 7.8 10*3/uL (ref 4.0–10.5)

## 2016-11-09 LAB — COMPREHENSIVE METABOLIC PANEL
ALBUMIN: 4.1 g/dL (ref 3.5–5.2)
ALK PHOS: 59 U/L (ref 39–117)
ALT: 18 U/L (ref 0–53)
AST: 16 U/L (ref 0–37)
BUN: 12 mg/dL (ref 6–23)
CHLORIDE: 106 meq/L (ref 96–112)
CO2: 27 mEq/L (ref 19–32)
CREATININE: 1.05 mg/dL (ref 0.40–1.50)
Calcium: 9 mg/dL (ref 8.4–10.5)
GFR: 99.58 mL/min (ref >60.00)
GLUCOSE: 109 mg/dL — AB (ref 70–99)
POTASSIUM: 4.2 meq/L (ref 3.5–5.1)
SODIUM: 139 meq/L (ref 135–145)
TOTAL PROTEIN: 6.7 g/dL (ref 6.0–8.3)
Total Bilirubin: 0.3 mg/dL (ref 0.2–1.2)

## 2016-11-09 LAB — LIPID PANEL
Cholesterol: 160 mg/dL (ref 0–200)
HDL: 37.8 mg/dL — ABNORMAL LOW (ref >39.00)
LDL CALC: 99 mg/dL (ref 0–99)
NONHDL: 121.97
Total CHOL/HDL Ratio: 4
Triglycerides: 113 mg/dL (ref 0.0–149.0)
VLDL: 22.6 mg/dL (ref 0.0–40.0)

## 2016-11-09 MED ORDER — MICONAZOLE NITRATE 2 % EX POWD: CUTANEOUS | 0 refills | Status: DC | PRN

## 2016-11-09 MED ORDER — VITAMIN D 50 MCG (2000 UT) PO TABS: 2000.0000 [IU] | ORAL_TABLET | Freq: Every day | ORAL | 1 refills | Status: AC

## 2016-11-09 MED ORDER — DICLOFENAC SODIUM 2 % TD SOLN: 1.0000 "application " | Freq: Two times a day (BID) | TRANSDERMAL | 0 refills | Status: DC

## 2016-11-09 NOTE — Progress Notes (Signed)
Subjective:    Patient ID: Jonathon Harris, male    DOB: 01/21/1976, 41 y.o.   MRN: [id number]  Patient presents today for complete physical  Arm Pain   The incident occurred more than 1 week ago (78months ago after lifting furniture). The incident occurred at home. The injury mechanism was repetitive motion and twisted. The pain is present in the left forearm and left elbow. The quality of the pain is described as aching and shooting. The pain radiates to the left arm. The pain has been intermittent since the incident. Pertinent negatives include no chest pain, muscle weakness, numbness or tingling. The symptoms are aggravated by movement, lifting and palpation. He has tried nothing for the symptoms.  denies any previous injury or surgery  Rash: Bilateral feet: Onset for several months. Worse with humidity. Improves with vinegar soaks. Associated with itching.  Immunizations: (TDAP, Hep C screen, Pneumovax, Influenza, zoster)  Health Maintenance  Topic Date Due  . HIV Screening  01/25/2017*  . Flu Shot  11/25/2016  . Tetanus Vaccine  02/09/2026  *Topic was postponed. The date shown is not the original due date.   Diet:healthy.  Weight:  Wt Readings from Last 3 Encounters:  11/09/16 175 lb (79.4 kg)  05/12/16 176 lb (79.8 kg)  02/10/16 181 lb (82.1 kg)    Exercise:walking   Fall Risk: Fall Risk  11/09/2016 02/10/2016  Falls in the past year? No No   Home Safety: home with wife.  Depression/Suicide: Depression screen [facility] 2/9 11/09/2016 02/10/2016  Decreased Interest 0 0  Down, Depressed, Hopeless 0 0  PHQ - 2 Score 0 0   Dental:needed, will schedule.  Vision:needed, will schedule.  Advanced Directive: Advanced Directives 02/10/2016  Does Patient Have a Medical Advance Directive? No  Would patient like information on creating a medical advance directive? No - patient declined information   Sexual History (birth control, marital status, STD):married, sexually  active  Medications and allergies reviewed with patient and updated if appropriate.  Patient Active Problem List   Diagnosis Date Noted  . Elevated BP without diagnosis of hypertension 05/14/2016  . Hyperlipidemia 05/12/2016  . Chronic bilateral back pain 02/10/2016    Current Outpatient Prescriptions on File Prior to Visit  Medication Sig Dispense Refill  . Omega-3 1000 MG CAPS Take 1 capsule (1,000 mg total) by mouth 2 (two) times daily after a meal. 180 capsule 1  . pravastatin (PRAVACHOL) 40 MG tablet Take 1 tablet (40 mg total) by mouth at bedtime. 90 tablet 1   No current facility-administered medications on file prior to visit.     No past medical history on file.  No past surgical history on file.  Social History   Social History  . Marital status: Single    Spouse name: N/A  . Number of children: N/A  . Years of education: N/A   Social History Main Topics  . Smoking status: Current Every Day Smoker    Packs/day: 0.50    Types: Cigarettes  . Smokeless tobacco: Current User  . Alcohol use 2.4 oz/week    4 Shots of liquor per week  . Drug use: Yes    Frequency: 1.0 time per week    Types: Marijuana  . Sexual activity: Yes   Other Topics Concern  . None   Social History Narrative  . None    Family History  Problem Relation Age of Onset  . Hypertension Father   . Cerebral aneurysm Father   . Heart  disease Father   . Stroke Brother   . Heart disease Brother   . Cancer Maternal Aunt   . Cancer Maternal Uncle   . Asthma Maternal Grandmother   . Asthma Maternal Grandfather         Review of Systems  Constitutional: Negative for fever, malaise/fatigue and weight loss.  HENT: Negative for congestion and sore throat.   Eyes:       Negative for visual changes  Respiratory: Negative for cough and shortness of breath.   Cardiovascular: Negative for chest pain, palpitations and leg swelling.  Gastrointestinal: Negative for blood in stool, constipation,  diarrhea and heartburn.  Genitourinary: Negative for dysuria, frequency and urgency.  Musculoskeletal: Positive for joint pain. Negative for falls and myalgias.  Skin: Positive for itching and rash.  Neurological: Negative for dizziness, tingling, sensory change, numbness and headaches.  Endo/Heme/Allergies: Does not bruise/bleed easily.  Psychiatric/Behavioral: Negative for depression, substance abuse and suicidal ideas. The patient is not nervous/anxious.     Objective:   Vitals:   11/09/16 1033  BP: 110/70  Pulse: 71  Temp: 98.5 F (36.9 C)    Body mass index is 27.41 kg/m.   Physical Examination:  Physical Exam  Constitutional: He is oriented to person, place, and time and well-developed, well-nourished, and in no distress. No distress.  HENT:  Right Ear: External ear normal.  Left Ear: External ear normal.  Nose: Nose normal.  Mouth/Throat: Oropharynx is clear and moist. No oropharyngeal exudate.  Eyes: Pupils are equal, round, and reactive to light. Conjunctivae and EOM are normal. No scleral icterus.  Neck: Normal range of motion. Neck supple. No thyromegaly present.  Cardiovascular: Normal rate, regular rhythm, normal heart sounds and intact distal pulses.   Pulmonary/Chest: Effort normal and breath sounds normal.  Abdominal: Bowel sounds are normal. He exhibits no distension. There is no tenderness.  Genitourinary: Rectum normal, prostate normal and penis normal. Rectal exam shows guaiac negative stool. No discharge found.  Musculoskeletal: Normal range of motion. He exhibits no edema.       Left shoulder: Normal.       Left elbow: He exhibits normal range of motion, no swelling and no effusion. Tenderness found. Lateral epicondyle tenderness noted. No radial head, no medial epicondyle and no olecranon process tenderness noted.       Left wrist: Normal.       Cervical back: Normal.       Left upper arm: Normal.       Left forearm: Normal.       Left hand: Normal.   Lymphadenopathy:    He has no cervical adenopathy.  Neurological: He is alert and oriented to person, place, and time. He has normal reflexes. No cranial nerve deficit. Gait normal.  Skin: Skin is warm and dry.  Psychiatric: Affect and judgment normal.  Vitals reviewed.   ASSESSMENT and PLAN:  Usiel was seen today for annual exam.  Diagnoses and all orders for this visit:  Encounter for preventative adult health care exam with abnormal findings -     CBC; Future -     Comprehensive metabolic panel; Future -     Lipid panel; Future -     IFOBT POC (occult bld, rslt in office); Future  Mixed hyperlipidemia  Tinea pedis of both feet -     miconazole (MICOTIN) 2 % powder; Apply topically as needed for itching.  Colon cancer screening -     IFOBT POC (occult bld, rslt in office); Future  Encounter for lipid screening for cardiovascular disease -     Lipid panel; Future  Vitamin D deficiency -     Cholecalciferol (VITAMIN D) 2000 units tablet; Take 1 tablet (2,000 Units total) by mouth daily.  Lateral epicondylitis of left elbow -     Diclofenac Sodium (PENNSAID) 2 % SOLN; Place 1 application onto the skin 2 (two) times daily at 10 AM and 5 PM.    No problem-specific Assessment & Plan notes found for this encounter.      Follow up: Return in about 6 months (around 05/12/2017) for hyperlipidemia.  Wilfred Lacy, NP

## 2016-11-09 NOTE — Patient Instructions (Addendum)
Please obtain counter strap for tennis and/or wrist brace from pharmacy of [employer] [facility] or [employer].  Wear brace daily (morning and night) x 1week, then every night x 1week Call office for referral to sport medicine if no improvement in 2week.  Go to basement for blood draw. You will be called with results  Declined use of any oral antifungal due to potential side effects.  Health Maintenance, Male A healthy lifestyle and preventive care is important for your health and wellness. Ask your health care provider about what schedule of regular examinations is right for you. What should I know about weight and diet? Eat a Healthy Diet  Eat plenty of vegetables, fruits, whole grains, low-fat dairy products, and lean protein.  Do not eat a lot of foods high in solid fats, added sugars, or salt.  Maintain a Healthy Weight Regular exercise can help you achieve or maintain a healthy weight. You should:  Do at least 150 minutes of exercise each week. The exercise should increase your heart rate and make you sweat (moderate-intensity exercise).  Do strength-training exercises at least twice a week.  Watch Your Levels of Cholesterol and Blood Lipids  Have your blood tested for lipids and cholesterol every 5 years starting at 42 years of age. If you are at high risk for heart disease, you should start having your blood tested when you are 42 years old. You may need to have your cholesterol levels checked more often if: ? Your lipid or cholesterol levels are high. ? You are older than 42 years of age. ? You are at high risk for heart disease.  What should I know about cancer screening? Many types of cancers can be detected early and may often be prevented. Lung Cancer  You should be screened every year for lung cancer if: ? You are a current smoker who has smoked for at least 30 years. ? You are a former smoker who has quit within the past 15 years.  Talk to your health care  provider about your screening options, when you should start screening, and how often you should be screened.  Colorectal Cancer  Routine colorectal cancer screening usually begins at 42 years of age and should be repeated every 5-10 years until you are 42 years old. You may need to be screened more often if early forms of precancerous polyps or small growths are found. Your health care provider may recommend screening at an earlier age if you have risk factors for colon cancer.  Your health care provider may recommend using home test kits to check for hidden blood in the stool.  A small camera at the end of a tube can be used to examine your colon (sigmoidoscopy or colonoscopy). This checks for the earliest forms of colorectal cancer.  Prostate and Testicular Cancer  Depending on your age and overall health, your health care provider may do certain tests to screen for prostate and testicular cancer.  Talk to your health care provider about any symptoms or concerns you have about testicular or prostate cancer.  Skin Cancer  Check your skin from head to toe regularly.  Tell your health care provider about any new moles or changes in moles, especially if: ? There is a change in a mole's size, shape, or color. ? You have a mole that is larger than a pencil eraser.  Always use sunscreen. Apply sunscreen liberally and repeat throughout the day.  Protect yourself by wearing long sleeves, pants, a  wide-brimmed hat, and sunglasses when outside.  What should I know about heart disease, diabetes, and high blood pressure?  If you are 14-36 years of age, have your blood pressure checked every 3-5 years. If you are 52 years of age or older, have your blood pressure checked every year. You should have your blood pressure measured twice-once when you are at a hospital or clinic, and once when you are not at a hospital or clinic. Record the average of the two measurements. To check your blood pressure  when you are not at a hospital or clinic, you can use: ? An automated blood pressure machine at a pharmacy. ? A home blood pressure monitor.  Talk to your health care provider about your target blood pressure.  If you are between 63-62 years old, ask your health care provider if you should take aspirin to prevent heart disease.  Have regular diabetes screenings by checking your fasting blood sugar level. ? If you are at a normal weight and have a low risk for diabetes, have this test once every three years after the age of 76. ? If you are overweight and have a high risk for diabetes, consider being tested at a younger age or more often.  A one-time screening for abdominal aortic aneurysm (AAA) by ultrasound is recommended for men aged 30-75 years who are current or former smokers. What should I know about preventing infection? Hepatitis B If you have a higher risk for hepatitis B, you should be screened for this virus. Talk with your health care provider to find out if you are at risk for hepatitis B infection. Hepatitis C Blood testing is recommended for:  Everyone born from [birth year] through 1966.  Anyone with known risk factors for hepatitis C.  Sexually Transmitted Diseases (STDs)  You should be screened each year for STDs including gonorrhea and chlamydia if: ? You are sexually active and are younger than 42 years of age. ? You are older than 42 years of age and your health care provider tells you that you are at risk for this type of infection. ? Your sexual activity has changed since you were last screened and you are at an increased risk for chlamydia or gonorrhea. Ask your health care provider if you are at risk.  Talk with your health care provider about whether you are at high risk of being infected with HIV. Your health care provider may recommend a prescription medicine to help prevent HIV infection.  What else can I do?  Schedule regular health, dental, and eye  exams.  Stay current with your vaccines (immunizations).  Do not use any tobacco products, such as cigarettes, chewing tobacco, and e-cigarettes. If you need help quitting, ask your health care provider.  Limit alcohol intake to no more than 2 drinks per day. One drink equals 12 ounces of beer, 5 ounces of wine, or 1 ounces of hard liquor.  Do not use street drugs.  Do not share needles.  Ask your health care provider for help if you need support or information about quitting drugs.  Tell your health care provider if you often feel depressed.  Tell your health care provider if you have ever been abused or do not feel safe at home. This information is not intended to replace advice given to you by your health care provider. Make sure you discuss any questions you have with your health care provider. Document Released: 10/10/2007 Document Revised: 12/11/2015 Document Reviewed: 01/15/2015 Elsevier Interactive Patient  Education  2018 [employer]  

## 2016-11-18 ENCOUNTER — Other Ambulatory Visit: Payer: Self-pay | Admitting: Nurse Practitioner

## 2016-11-18 DIAGNOSIS — E782 Mixed hyperlipidemia: Secondary | ICD-10-CM

## 2016-11-18 NOTE — Telephone Encounter (Signed)
Left vm for pt to call back, need to know if the pt is out of this med.

## 2016-11-19 NOTE — Telephone Encounter (Signed)
Pt has 6 months of Rx left, does not need a refill

## 2016-11-19 NOTE — Telephone Encounter (Signed)
Left another vm for pt to call back, need to make sure pt is out of this med.

## 2017-03-15 ENCOUNTER — Telehealth: Payer: Self-pay | Admitting: Behavioral Health

## 2017-03-15 NOTE — Telephone Encounter (Signed)
Patient voiced that he's scheduled to have a dental procedure completed in the upcoming week & the dentist is requesting a letter, stating the patient's heart murmur is not a health issue at this time. The contact info is listed below.  Lovena Neighbours, DDS Office number [phone] Fax number [phone]

## 2017-03-15 NOTE — Telephone Encounter (Signed)
I have no documentation of him having a heart murmur. If this is a new finding, he needs an OV with me.

## 2017-03-16 ENCOUNTER — Encounter: Payer: Self-pay | Admitting: Nurse Practitioner

## 2017-03-16 NOTE — Telephone Encounter (Signed)
Called & spoke with patient regarding the below. He stated, that he had informed the dentist of "having a heart murmur as a child", but no concerns as an adult. The patient voiced that they are requesting documentation to support that this is not a new diagnosis.  Message routed to PCP for review.

## 2017-03-16 NOTE — Telephone Encounter (Signed)
Letter done

## 2017-03-17 NOTE — Telephone Encounter (Signed)
Letter fax to below fax #.

## 2017-12-21 DIAGNOSIS — M9902 Segmental and somatic dysfunction of thoracic region: Secondary | ICD-10-CM | POA: Diagnosis not present

## 2017-12-21 DIAGNOSIS — M2560 Stiffness of unspecified joint, not elsewhere classified: Secondary | ICD-10-CM | POA: Diagnosis not present

## 2017-12-21 DIAGNOSIS — M546 Pain in thoracic spine: Secondary | ICD-10-CM | POA: Diagnosis not present

## 2017-12-21 DIAGNOSIS — R293 Abnormal posture: Secondary | ICD-10-CM | POA: Diagnosis not present

## 2017-12-30 DIAGNOSIS — M546 Pain in thoracic spine: Secondary | ICD-10-CM | POA: Diagnosis not present

## 2017-12-30 DIAGNOSIS — M2560 Stiffness of unspecified joint, not elsewhere classified: Secondary | ICD-10-CM | POA: Diagnosis not present

## 2017-12-30 DIAGNOSIS — M9902 Segmental and somatic dysfunction of thoracic region: Secondary | ICD-10-CM | POA: Diagnosis not present

## 2017-12-30 DIAGNOSIS — R293 Abnormal posture: Secondary | ICD-10-CM | POA: Diagnosis not present

## 2019-12-31 DIAGNOSIS — H16012 Central corneal ulcer, left eye: Secondary | ICD-10-CM | POA: Diagnosis not present

## 2020-01-05 DIAGNOSIS — H16012 Central corneal ulcer, left eye: Secondary | ICD-10-CM | POA: Diagnosis not present

## 2020-02-01 ENCOUNTER — Other Ambulatory Visit (INDEPENDENT_AMBULATORY_CARE_PROVIDER_SITE_OTHER): Payer: BC Managed Care – PPO

## 2020-02-01 ENCOUNTER — Other Ambulatory Visit: Payer: Self-pay

## 2020-02-01 ENCOUNTER — Encounter: Payer: Self-pay | Admitting: Nurse Practitioner

## 2020-02-01 ENCOUNTER — Ambulatory Visit (INDEPENDENT_AMBULATORY_CARE_PROVIDER_SITE_OTHER): Payer: BC Managed Care – PPO | Admitting: Nurse Practitioner

## 2020-02-01 VITALS — BP 108/70 | HR 78 | Temp 98.1°F | Ht 66.75 in | Wt 159.4 lb

## 2020-02-01 DIAGNOSIS — Z0001 Encounter for general adult medical examination with abnormal findings: Secondary | ICD-10-CM

## 2020-02-01 DIAGNOSIS — R7989 Other specified abnormal findings of blood chemistry: Secondary | ICD-10-CM

## 2020-02-01 DIAGNOSIS — Z800 Family history of malignant neoplasm of digestive organs: Secondary | ICD-10-CM

## 2020-02-01 DIAGNOSIS — Z1211 Encounter for screening for malignant neoplasm of colon: Secondary | ICD-10-CM | POA: Diagnosis not present

## 2020-02-01 DIAGNOSIS — Z125 Encounter for screening for malignant neoplasm of prostate: Secondary | ICD-10-CM

## 2020-02-01 DIAGNOSIS — E782 Mixed hyperlipidemia: Secondary | ICD-10-CM

## 2020-02-01 DIAGNOSIS — Z0000 Encounter for general adult medical examination without abnormal findings: Secondary | ICD-10-CM | POA: Diagnosis not present

## 2020-02-01 DIAGNOSIS — D171 Benign lipomatous neoplasm of skin and subcutaneous tissue of trunk: Secondary | ICD-10-CM | POA: Diagnosis not present

## 2020-02-01 DIAGNOSIS — F17200 Nicotine dependence, unspecified, uncomplicated: Secondary | ICD-10-CM

## 2020-02-01 DIAGNOSIS — Z87891 Personal history of nicotine dependence: Secondary | ICD-10-CM | POA: Insufficient documentation

## 2020-02-01 DIAGNOSIS — R739 Hyperglycemia, unspecified: Secondary | ICD-10-CM

## 2020-02-01 LAB — CBC WITH DIFFERENTIAL/PLATELET
Basophils Absolute: 0.1 10*3/uL (ref 0.0–0.1)
Basophils Relative: 0.8 % (ref 0.0–3.0)
Eosinophils Absolute: 0 10*3/uL (ref 0.0–0.7)
Eosinophils Relative: 0.7 % (ref 0.0–5.0)
HCT: 44.1 % (ref 39.0–52.0)
Hemoglobin: 14.6 g/dL (ref 13.0–17.0)
Lymphocytes Relative: 56.1 % — ABNORMAL HIGH (ref 12.0–46.0)
Lymphs Abs: 3.8 10*3/uL (ref 0.7–4.0)
MCHC: 33.1 g/dL (ref 30.0–36.0)
MCV: 89.3 fl (ref 78.0–100.0)
Monocytes Absolute: 0.5 10*3/uL (ref 0.1–1.0)
Monocytes Relative: 7.9 % (ref 3.0–12.0)
Neutro Abs: 2.3 10*3/uL (ref 1.4–7.7)
Neutrophils Relative %: 34.5 % — ABNORMAL LOW (ref 43.0–77.0)
Platelets: 206 10*3/uL (ref 150.0–400.0)
RBC: 4.93 Mil/uL (ref 4.22–5.81)
RDW: 14.2 % (ref 11.5–15.5)
WBC: 6.7 10*3/uL (ref 4.0–10.5)

## 2020-02-01 LAB — COMPREHENSIVE METABOLIC PANEL
ALT: 15 U/L (ref 0–53)
AST: 13 U/L (ref 0–37)
Albumin: 4.3 g/dL (ref 3.5–5.2)
Alkaline Phosphatase: 66 U/L (ref 39–117)
BUN: 9 mg/dL (ref 6–23)
CO2: 29 mEq/L (ref 19–32)
Calcium: 9.3 mg/dL (ref 8.4–10.5)
Chloride: 104 mEq/L (ref 96–112)
Creatinine, Ser: 1.01 mg/dL (ref 0.40–1.50)
GFR: 89.22 mL/min (ref >60.00)
Glucose, Bld: 116 mg/dL — ABNORMAL HIGH (ref 70–99)
Potassium: 4.1 mEq/L (ref 3.5–5.1)
Sodium: 139 mEq/L (ref 135–145)
Total Bilirubin: 0.5 mg/dL (ref 0.2–1.2)
Total Protein: 6.7 g/dL (ref 6.0–8.3)

## 2020-02-01 LAB — LIPID PANEL
Cholesterol: 183 mg/dL (ref 0–200)
HDL: 36.4 mg/dL — ABNORMAL LOW (ref >39.00)
LDL Cholesterol: 124 mg/dL — ABNORMAL HIGH (ref 0–99)
NonHDL: 146.37
Total CHOL/HDL Ratio: 5
Triglycerides: 110 mg/dL (ref 0.0–149.0)
VLDL: 22 mg/dL (ref 0.0–40.0)

## 2020-02-01 LAB — IFOBT (OCCULT BLOOD): IFOBT: NEGATIVE

## 2020-02-01 LAB — PSA: PSA: 1.61 ng/mL (ref 0.10–4.00)

## 2020-02-01 LAB — TSH: TSH: 0.23 u[IU]/mL — ABNORMAL LOW (ref 0.35–4.50)

## 2020-02-01 NOTE — Progress Notes (Signed)
Subjective:    Patient ID: Jonathon Harris, male    DOB: 1975-08-16, 44 y.o.   MRN: [id number]  Patient presents today for CPE and  eval of chronic conditions  HPI Lipoma of torso Chronic, but Increase in size over last 3year, intermittent tenderness. Over lower sternum  Family hx of colon cancer Mother at age 48 negative iFOB today Entered referral to GI to discuss colon cancer screen  Tobacco use disorder X 71yrs. Cutting down on his own with use of vaping and nicotine salt No claudication or cough or SOB or LE edema or neuropathy I spent 6mins discussing different ways to quit, and possible adverse effects from tobacco/nicotine/vaping use (oral/ throat/ lung cancer, gum disease, COPD, CAD/CVD, PAD etc) He declined to use chantix or zyban or patch or gum.  Hyperlipidemia Discontinue pravastatin 1year ago Reports he has made changes to diet. Continue tobacco use.  Check lipid panel today  Sexual History (orientation,birth control, marital status, STD):married, sexually active, denies any testicular mass or pain or swelling, denies any urinary symptoms  Depression/Suicide: Depression screen [facility] 2/9 02/01/2020 11/09/2016 02/10/2016  Decreased Interest 1 0 0  Down, Depressed, Hopeless 1 0 0  PHQ - 2 Score 2 0 0  Altered sleeping 2 - -  Tired, decreased energy 1 - -  Change in appetite 0 - -  Feeling bad or failure about yourself  0 - -  Trouble concentrating 0 - -  Moving slowly or fidgety/restless 0 - -  Suicidal thoughts 0 - -  PHQ-9 Score 5 - -  Difficult doing work/chores Not difficult at all - -   Vision:not needed  Dental:up to date  Immunizations: (TDAP, Hep C screen, Pneumovax, Influenza, zoster)  Health Maintenance  Topic Date Due    Hepatitis C: One time screening is recommended by Center for Disease Control  (CDC) for  adults born from [birth year] through 1966.   Never done   COVID-19 Vaccine (1) Never done   HIV Screening  Never done   Flu Shot  Never  done   Tetanus Vaccine  02/09/2026   Diet:heart hearthy  Weight:  Wt Readings from Last 3 Encounters:  02/01/20 159 lb 6.4 oz (72.3 kg)  11/09/16 175 lb (79.4 kg)  05/12/16 176 lb (79.8 kg)    Fall Risk: Fall Risk  11/09/2016 02/10/2016  Falls in the past year? No No   Advanced Directive: Advanced Directives 02/10/2016  Does Patient Have a Medical Advance Directive? No  Would patient like information on creating a medical advance directive? No - patient declined information    Medications and allergies reviewed with patient and updated if appropriate.  Patient Active Problem List   Diagnosis Date Noted   Family hx of colon cancer 02/01/2020   Lipoma of torso 02/01/2020   Tobacco use disorder 02/01/2020   Elevated BP without diagnosis of hypertension 05/14/2016   Hyperlipidemia 05/12/2016   Chronic bilateral back pain 02/10/2016    Current Outpatient Medications on File Prior to Visit  Medication Sig Dispense Refill   Cholecalciferol (VITAMIN D) 2000 units tablet Take 1 tablet (2,000 Units total) by mouth daily. 90 tablet 1   Omega-3 1000 MG CAPS Take 1 capsule (1,000 mg total) by mouth 2 (two) times daily after a meal. 180 capsule 1   Diclofenac Sodium (PENNSAID) 2 % SOLN Place 1 application onto the skin 2 (two) times daily at 10 AM and 5 PM. (Patient not taking: Reported on 02/01/2020) 2 g 0  miconazole (MICOTIN) 2 % powder Apply topically as needed for itching. (Patient not taking: Reported on 02/01/2020) 70 g 0   pravastatin (PRAVACHOL) 40 MG tablet Take 1 tablet (40 mg total) by mouth at bedtime. (Patient not taking: Reported on 02/01/2020) 90 tablet 1   No current facility-administered medications on file prior to visit.    History reviewed. No pertinent past medical history.  History reviewed. No pertinent surgical history.  Social History   Socioeconomic History   Marital status: Married    Spouse name: Not on file   Number of children: Not on  file   Years of education: Not on file   Highest education level: Not on file  Occupational History   Not on file  Tobacco Use   Smoking status: Current Every Day Smoker    Packs/day: 0.25    Types: Cigarettes   Smokeless tobacco: Current User  Vaping Use   Vaping Use: Every day   Substances: Nicotine, Nicotine-salt  Substance and Sexual Activity   Alcohol use: Yes    Alcohol/week: 2.0 standard drinks    Types: 2 Shots of liquor per week   Drug use: Yes    Frequency: 7.0 times per week    Types: Marijuana   Sexual activity: Yes    Birth control/protection: None  Other Topics Concern   Not on file  Social History Narrative   Not on file   Social Determinants of Health   Financial Resource Strain:    Difficulty of Paying Living Expenses: Not on file  Food Insecurity:    Worried About Charity fundraiser in the Last Year: Not on file   [employer] of Food in the Last Year: Not on file  Transportation Needs:    Lack of Transportation (Medical): Not on file   Lack of Transportation (Non-Medical): Not on file  Physical Activity:    Days of Exercise per Week: Not on file   Minutes of Exercise per Session: Not on file  Stress:    Feeling of Stress : Not on file  Social Connections:    Frequency of Communication with Friends and Family: Not on file   Frequency of Social Gatherings with Friends and Family: Not on file   Attends Religious Services: Not on file   Active Member of Clubs or Organizations: Not on file   Attends Archivist Meetings: Not on file   Marital Status: Not on file   Family History  Problem Relation Age of Onset   Hypertension Father    Cerebral aneurysm Father    Heart disease Father    Stroke Brother    Heart disease Brother    Cancer Maternal Aunt    Cancer Maternal Uncle    Asthma Maternal Grandmother    Asthma Maternal Grandfather    Cancer Mother 87       breast and colon cancer       Review of  Systems  Constitutional: Negative for fever, malaise/fatigue and weight loss.  HENT: Negative for congestion and sore throat.   Eyes:       Negative for visual changes  Respiratory: Negative for cough and shortness of breath.   Cardiovascular: Negative for chest pain, palpitations and leg swelling.  Gastrointestinal: Negative for blood in stool, constipation, diarrhea and heartburn.  Genitourinary: Negative for dysuria, frequency and urgency.  Musculoskeletal: Negative for falls, joint pain and myalgias.  Skin: Positive for rash.  Neurological: Negative for dizziness, sensory change and headaches.  Endo/Heme/Allergies:  Does not bruise/bleed easily.  Psychiatric/Behavioral: Negative for depression, substance abuse and suicidal ideas. The patient is not nervous/anxious and does not have insomnia.     Objective:   Vitals:   02/01/20 1031  BP: 108/70  Pulse: 78  Temp: 98.1 F (36.7 C)  SpO2: 99%    Body mass index is 25.15 kg/m.   Physical Examination:  Physical Exam Vitals reviewed. Exam conducted with a chaperone present.  Constitutional:      General: He is not in acute distress.    Appearance: He is well-developed.  HENT:     Right Ear: Tympanic membrane, ear canal and external ear normal.     Left Ear: Tympanic membrane, ear canal and external ear normal.  Eyes:     Extraocular Movements: Extraocular movements intact.     Conjunctiva/sclera: Conjunctivae normal.  Cardiovascular:     Rate and Rhythm: Normal rate and regular rhythm.     Pulses: Normal pulses.     Heart sounds: Normal heart sounds.  Pulmonary:     Effort: Pulmonary effort is normal. No respiratory distress.     Breath sounds: Normal breath sounds.  Chest:     Chest wall: Mass present. No tenderness.    Abdominal:     General: Bowel sounds are normal.     Palpations: Abdomen is soft.  Genitourinary:    Prostate: Normal.     Rectum: Normal. Guaiac result negative.  Musculoskeletal:         General: Normal range of motion.     Cervical back: Normal range of motion and neck supple.     Right lower leg: No edema.     Left lower leg: No edema.  Lymphadenopathy:     Cervical: No cervical adenopathy.  Skin:    General: Skin is warm and dry.  Neurological:     Mental Status: He is alert and oriented to person, place, and time.     Deep Tendon Reflexes: Reflexes are normal and symmetric.  Psychiatric:        Mood and Affect: Mood normal.        Behavior: Behavior normal.        Thought Content: Thought content normal.    ASSESSMENT and PLAN: This visit occurred during the SARS-CoV-2 public health emergency.  Safety protocols were in place, including screening questions prior to the visit, additional usage of staff PPE, and extensive cleaning of exam room while observing appropriate contact time as indicated for disinfecting solutions.   Lamount was seen today for annual exam.  Diagnoses and all orders for this visit:  Encounter for preventative adult health care exam with abnormal findings -     PSA; Future -     CBC with Differential/Platelet; Future -     Comprehensive metabolic panel; Future -     TSH; Future -     Cancel: IFOBT POC (occult bld, rslt in office); Future  Mixed hyperlipidemia -     Lipid panel; Future  Prostate cancer screening -     PSA; Future  Family hx of colon cancer -     Cancel: IFOBT POC (occult bld, rslt in office); Future -     Ambulatory referral to Gastroenterology  Encounter for screening fecal occult blood testing -     Cancel: IFOBT POC (occult bld, rslt in office); Future -     IFOBT POC (occult bld, rslt in office)  Lipoma of torso -     Ambulatory referral to Dermatology  Tobacco use disorder      Problem List Items Addressed This Visit      Other   Family hx of colon cancer    Mother at age 26 negative iFOB today Entered referral to GI to discuss colon cancer screen      Relevant Orders   Ambulatory referral to  Gastroenterology   Hyperlipidemia    Discontinue pravastatin 1year ago Reports he has made changes to diet. Continue tobacco use.  Check lipid panel today      Relevant Orders   Lipid panel   Lipoma of torso    Chronic, but Increase in size over last 3year, intermittent tenderness. Over lower sternum      Relevant Orders   Ambulatory referral to Dermatology   Tobacco use disorder    X 33yrs. Cutting down on his own with use of vaping and nicotine salt No claudication or cough or SOB or LE edema or neuropathy I spent 89mins discussing different ways to quit, and possible adverse effects from tobacco/nicotine/vaping use (oral/ throat/ lung cancer, gum disease, COPD, CAD/CVD, PAD etc) He declined to use chantix or zyban or patch or gum.       Other Visit Diagnoses    Encounter for preventative adult health care exam with abnormal findings    -  Primary   Relevant Orders   PSA   CBC with Differential/Platelet   Comprehensive metabolic panel   TSH   Prostate cancer screening       Relevant Orders   PSA   Encounter for screening fecal occult blood testing       Relevant Orders   IFOBT POC (occult bld, rslt in office) (Completed)       Follow up: Return in about 6 months (around 08/01/2020) for hyperlipidemia.  Wilfred Lacy, NP

## 2020-02-01 NOTE — Assessment & Plan Note (Addendum)
Discontinue pravastatin 1year ago Reports he has made changes to diet. Continue tobacco use.  Check lipid panel today

## 2020-02-01 NOTE — Assessment & Plan Note (Addendum)
X 70yrs. Cutting down on his own with use of vaping and nicotine salt No claudication or cough or SOB or LE edema or neuropathy I spent 62mins discussing different ways to quit, and possible adverse effects from tobacco/nicotine/vaping use (oral/ throat/ lung cancer, gum disease, COPD, CAD/CVD, PAD etc) He declined to use chantix or zyban or patch or gum.

## 2020-02-01 NOTE — Patient Instructions (Signed)
Go to [address] for blood draw  You will be contacted to schedule appt with dermatology and GI.   Health Maintenance, Male Adopting a healthy lifestyle and getting preventive care are important in promoting health and wellness. Ask your health care provider about:  The right schedule for you to have regular tests and exams.  Things you can do on your own to prevent diseases and keep yourself healthy. What should I know about diet, weight, and exercise? Eat a healthy diet   Eat a diet that includes plenty of vegetables, fruits, low-fat dairy products, and lean protein.  Do not eat a lot of foods that are high in solid fats, added sugars, or sodium. Maintain a healthy weight Body mass index (BMI) is a measurement that can be used to identify possible weight problems. It estimates body fat based on height and weight. Your health care provider can help determine your BMI and help you achieve or maintain a healthy weight. Get regular exercise Get regular exercise. This is one of the most important things you can do for your health. Most adults should:  Exercise for at least 150 minutes each week. The exercise should increase your heart rate and make you sweat (moderate-intensity exercise).  Do strengthening exercises at least twice a week. This is in addition to the moderate-intensity exercise.  Spend less time sitting. Even light physical activity can be beneficial. Watch cholesterol and blood lipids Have your blood tested for lipids and cholesterol at 45 years of age, then have this test every 5 years. You may need to have your cholesterol levels checked more often if:  Your lipid or cholesterol levels are high.  You are older than 45 years of age.  You are at high risk for heart disease. What should I know about cancer screening? Many types of cancers can be detected early and may often be prevented. Depending on your health history and family history, you may need to have  cancer screening at various ages. This may include screening for:  Colorectal cancer.  Prostate cancer.  Skin cancer.  Lung cancer. What should I know about heart disease, diabetes, and high blood pressure? Blood pressure and heart disease  High blood pressure causes heart disease and increases the risk of stroke. This is more likely to develop in people who have high blood pressure readings, are of African descent, or are overweight.  Talk with your health care provider about your target blood pressure readings.  Have your blood pressure checked: ? Every 3-5 years if you are 40-33 years of age. ? Every year if you are 61 years old or older.  If you are between the ages of 47 and 60 and are a current or former smoker, ask your health care provider if you should have a one-time screening for abdominal aortic aneurysm (AAA). Diabetes Have regular diabetes screenings. This checks your fasting blood sugar level. Have the screening done:  Once every three years after age 96 if you are at a normal weight and have a low risk for diabetes.  More often and at a younger age if you are overweight or have a high risk for diabetes. What should I know about preventing infection? Hepatitis B If you have a higher risk for hepatitis B, you should be screened for this virus. Talk with your health care provider to find out if you are at risk for hepatitis B infection. Hepatitis C Blood testing is recommended for:  Everyone born from  1945 through [birth year].  Anyone with known risk factors for hepatitis C. Sexually transmitted infections (STIs)  You should be screened each year for STIs, including gonorrhea and chlamydia, if: ? You are sexually active and are younger than 45 years of age. ? You are older than 45 years of age and your health care provider tells you that you are at risk for this type of infection. ? Your sexual activity has changed since you were last screened, and you are at increased  risk for chlamydia or gonorrhea. Ask your health care provider if you are at risk.  Ask your health care provider about whether you are at high risk for HIV. Your health care provider may recommend a prescription medicine to help prevent HIV infection. If you choose to take medicine to prevent HIV, you should first get tested for HIV. You should then be tested every 3 months for as long as you are taking the medicine. Follow these instructions at home: Lifestyle  Do not use any products that contain nicotine or tobacco, such as cigarettes, e-cigarettes, and chewing tobacco. If you need help quitting, ask your health care provider.  Do not use street drugs.  Do not share needles.  Ask your health care provider for help if you need support or information about quitting drugs. Alcohol use  Do not drink alcohol if your health care provider tells you not to drink.  If you drink alcohol: ? Limit how much you have to 0-2 drinks a day. ? Be aware of how much alcohol is in your drink. In the [place] one drink equals one 12 oz bottle of beer (355 mL), one 5 oz glass of wine (148 mL), or one 1 oz glass of hard liquor (44 mL). General instructions  Schedule regular health, dental, and eye exams.  Stay current with your vaccines.  Tell your health care provider if: ? You often feel depressed. ? You have ever been abused or do not feel safe at home. Summary  Adopting a healthy lifestyle and getting preventive care are important in promoting health and wellness.  Follow your health care provider's instructions about healthy diet, exercising, and getting tested or screened for diseases.  Follow your health care provider's instructions on monitoring your cholesterol and blood pressure. This information is not intended to replace advice given to you by your health care provider. Make sure you discuss any questions you have with your health care provider. Document Revised: 04/06/2018 Document  Reviewed: 04/06/2018 Elsevier Patient Education  2020 [employer].

## 2020-02-01 NOTE — Assessment & Plan Note (Addendum)
Mother at age 45 negative iFOB today Entered referral to GI to discuss colon cancer screen

## 2020-02-01 NOTE — Assessment & Plan Note (Signed)
Chronic, but Increase in size over last 3year, intermittent tenderness. Over lower sternum

## 2020-02-06 ENCOUNTER — Other Ambulatory Visit: Payer: Self-pay

## 2020-02-06 ENCOUNTER — Other Ambulatory Visit (INDEPENDENT_AMBULATORY_CARE_PROVIDER_SITE_OTHER): Payer: BC Managed Care – PPO

## 2020-02-06 DIAGNOSIS — R739 Hyperglycemia, unspecified: Secondary | ICD-10-CM | POA: Diagnosis not present

## 2020-02-06 DIAGNOSIS — R7989 Other specified abnormal findings of blood chemistry: Secondary | ICD-10-CM | POA: Diagnosis not present

## 2020-02-06 DIAGNOSIS — R7303 Prediabetes: Secondary | ICD-10-CM | POA: Diagnosis not present

## 2020-02-06 DIAGNOSIS — E0590 Thyrotoxicosis, unspecified without thyrotoxic crisis or storm: Secondary | ICD-10-CM

## 2020-02-06 DIAGNOSIS — D485 Neoplasm of uncertain behavior of skin: Secondary | ICD-10-CM | POA: Diagnosis not present

## 2020-02-06 LAB — HEMOGLOBIN A1C: Hgb A1c MFr Bld: 6.1 % (ref 4.6–6.5)

## 2020-02-07 ENCOUNTER — Encounter: Payer: BLUE CROSS/BLUE SHIELD | Admitting: Nurse Practitioner

## 2020-02-07 LAB — THYROID PANEL WITH TSH
Free Thyroxine Index: 2.5 (ref 1.4–3.8)
T3 Uptake: 34 % (ref 22–35)
T4, Total: 7.4 ug/dL (ref 4.9–10.5)
TSH: 0.22 mIU/L — ABNORMAL LOW (ref 0.40–4.50)

## 2020-02-09 DIAGNOSIS — R7303 Prediabetes: Secondary | ICD-10-CM | POA: Insufficient documentation

## 2020-02-09 DIAGNOSIS — E0590 Thyrotoxicosis, unspecified without thyrotoxic crisis or storm: Secondary | ICD-10-CM | POA: Insufficient documentation

## 2020-02-09 NOTE — Addendum Note (Signed)
Addended by: Wilfred Lacy L on: 02/09/2020 03:57 PM   Modules accepted: Orders

## 2020-02-12 DIAGNOSIS — L723 Sebaceous cyst: Secondary | ICD-10-CM | POA: Diagnosis not present

## 2020-02-12 DIAGNOSIS — D485 Neoplasm of uncertain behavior of skin: Secondary | ICD-10-CM | POA: Diagnosis not present

## 2020-03-12 ENCOUNTER — Encounter: Payer: Self-pay | Admitting: Gastroenterology

## 2020-03-15 ENCOUNTER — Ambulatory Visit (INDEPENDENT_AMBULATORY_CARE_PROVIDER_SITE_OTHER): Payer: BC Managed Care – PPO | Admitting: Endocrinology

## 2020-03-15 ENCOUNTER — Encounter: Payer: Self-pay | Admitting: Endocrinology

## 2020-03-15 ENCOUNTER — Other Ambulatory Visit: Payer: Self-pay

## 2020-03-15 DIAGNOSIS — R7989 Other specified abnormal findings of blood chemistry: Secondary | ICD-10-CM | POA: Insufficient documentation

## 2020-03-15 DIAGNOSIS — Z8639 Personal history of other endocrine, nutritional and metabolic disease: Secondary | ICD-10-CM | POA: Insufficient documentation

## 2020-03-15 DIAGNOSIS — E0590 Thyrotoxicosis, unspecified without thyrotoxic crisis or storm: Secondary | ICD-10-CM | POA: Diagnosis not present

## 2020-03-15 NOTE — Patient Instructions (Addendum)
Let's check the ultrasound.  you will receive a phone call, about a day and time for an appointment. If there are nodules, the next step is to check a nuclear medicine scan. Please also consider the treatment options we discussed, and let me know.      Hyperthyroidism  Hyperthyroidism is when the thyroid gland is too active (overactive). The thyroid gland is a small gland located in the lower front part of the neck, just in front of the windpipe (trachea). This gland makes hormones that help control how the body uses food for energy (metabolism) as well as how the heart and brain function. These hormones also play a role in keeping your bones strong. When the thyroid is overactive, it produces too much of a hormone called thyroxine. What are the causes? This condition may be caused by:  Graves' disease. This is a disorder in which the body's disease-fighting system (immune system) attacks the thyroid gland. This is the most common cause.  Inflammation of the thyroid gland.  A tumor in the thyroid gland.  Use of certain medicines, including: ? Prescription thyroid hormone replacement. ? Herbal supplements that mimic thyroid hormones. ? Amiodarone therapy.  Solid or fluid-filled lumps within your thyroid gland (thyroid nodules).  Taking in a large amount of iodine from foods or medicines. What increases the risk? You are more likely to develop this condition if:  You are male.  You have a family history of thyroid conditions.  You smoke tobacco.  You use a medicine called lithium.  You take medicines that affect the immune system (immunosuppressants). What are the signs or symptoms? Symptoms of this condition include:  Nervousness.  Inability to tolerate heat.  Unexplained weight loss.  Diarrhea.  Change in the texture of hair or skin.  Heart skipping beats or making extra beats.  Rapid heart rate.  Loss of menstruation.  Shaky  hands.  Fatigue.  Restlessness.  Sleep problems.  Enlarged thyroid gland or a lump in the thyroid (nodule). You may also have symptoms of Graves' disease, which may include:  Protruding eyes.  Dry eyes.  Red or swollen eyes.  Problems with vision. How is this diagnosed? This condition may be diagnosed based on:  Your symptoms and medical history.  A physical exam.  Blood tests.  Thyroid ultrasound. This test involves using sound waves to produce images of the thyroid gland.  A thyroid scan. A radioactive substance is injected into a vein, and images show how much iodine is present in the thyroid.  Radioactive iodine uptake test (RAIU). A small amount of radioactive iodine is given by mouth to see how much iodine the thyroid absorbs after a certain amount of time. How is this treated? Treatment depends on the cause and severity of the condition. Treatment may include:  Medicines to reduce the amount of thyroid hormone your body makes.  Radioactive iodine treatment (radioiodine therapy). This involves swallowing a small dose of radioactive iodine, in capsule or liquid form, to kill thyroid cells.  Surgery to remove part or all of your thyroid gland. You may need to take thyroid hormone replacement medicine for the rest of your life after thyroid surgery.  Medicines to help manage your symptoms. Follow these instructions at home:   Take over-the-counter and prescription medicines only as told by your health care provider.  Do not use any products that contain nicotine or tobacco, such as cigarettes and e-cigarettes. If you need help quitting, ask your health care provider.  Follow  any instructions from your health care provider about diet. You may be instructed to limit foods that contain iodine.  Keep all follow-up visits as told by your health care provider. This is important. ? You will need to have blood tests regularly so that your health care provider can  monitor your condition. Contact a health care provider if:  Your symptoms do not get better with treatment.  You have a fever.  You are taking thyroid hormone replacement medicine and you: ? Have symptoms of depression. ? Feel like you are tired all the time. ? Gain weight. Get help right away if:  You have chest pain.  You have decreased alertness or a change in your awareness.  You have abdominal pain.  You feel dizzy.  You have a rapid heartbeat.  You have an irregular heartbeat.  You have difficulty breathing. Summary  The thyroid gland is a small gland located in the lower front part of the neck, just in front of the windpipe (trachea).  Hyperthyroidism is when the thyroid gland is too active (overactive) and produces too much of a hormone called thyroxine.  The most common cause is Graves' disease, a disorder in which your immune system attacks the thyroid gland.  Hyperthyroidism can cause various symptoms, such as unexplained weight loss, nervousness, inability to tolerate heat, or changes in your heartbeat.  Treatment may include medicine to reduce the amount of thyroid hormone your body makes, radioiodine therapy, surgery, or medicines to manage symptoms. This information is not intended to replace advice given to you by your health care provider. Make sure you discuss any questions you have with your health care provider. Document Revised: 03/26/2017 Document Reviewed: 03/24/2017 Elsevier Patient Education  2020 [employer].

## 2020-03-15 NOTE — Progress Notes (Signed)
Subjective:    Patient ID: Jonathon Harris, male    DOB: [birth year], 44 y.o.   MRN: [id number]  HPI Pt is referred by Wilfred Lacy, NP, for hyperthyroidism.  he was dx'ed with hyperthyroidism in 2013.  He has never been on therapy for this.  He has never had XRT to the anterior neck, or thyroid surgery.  He has never had thyroid imaging.  He does not consume kelp or any other non-prescribed thyroid medication.  He has never been on amiodarone.  He has lost 30 lbs x 18 mos--intentional.  He has slight tremor and anxiety No past medical history on file.  No past surgical history on file.  Social History   Socioeconomic History  . Marital status: Married    Spouse name: Not on file  . Number of children: Not on file  . Years of education: Not on file  . Highest education level: Not on file  Occupational History  . Not on file  Tobacco Use  . Smoking status: Current Every Day Smoker    Packs/day: 0.25    Types: Cigarettes  . Smokeless tobacco: Current User  Vaping Use  . Vaping Use: Every day  . Substances: Nicotine, Nicotine-salt  Substance and Sexual Activity  . Alcohol use: Yes    Alcohol/week: 2.0 standard drinks    Types: 2 Shots of liquor per week  . Drug use: Yes    Frequency: 7.0 times per week    Types: Marijuana  . Sexual activity: Yes    Birth control/protection: None  Other Topics Concern  . Not on file  Social History Narrative  . Not on file   Social Determinants of Health   Financial Resource Strain:   . Difficulty of Paying Living Expenses: Not on file  Food Insecurity:   . Worried About Charity fundraiser in the Last Year: Not on file  . Ran Out of Food in the Last Year: Not on file  Transportation Needs:   . Lack of Transportation (Medical): Not on file  . Lack of Transportation (Non-Medical): Not on file  Physical Activity:   . Days of Exercise per Week: Not on file  . Minutes of Exercise per Session: Not on file  Stress:   . Feeling of  Stress : Not on file  Social Connections:   . Frequency of Communication with Friends and Family: Not on file  . Frequency of Social Gatherings with Friends and Family: Not on file  . Attends Religious Services: Not on file  . Active Member of Clubs or Organizations: Not on file  . Attends Archivist Meetings: Not on file  . Marital Status: Not on file  Intimate Partner Violence:   . Fear of Current or Ex-Partner: Not on file  . Emotionally Abused: Not on file  . Physically Abused: Not on file  . Sexually Abused: Not on file    Current Outpatient Medications on File Prior to Visit  Medication Sig Dispense Refill  . Cholecalciferol (VITAMIN D) 2000 units tablet Take 1 tablet (2,000 Units total) by mouth daily. 90 tablet 1  . Omega-3 1000 MG CAPS Take 1 capsule (1,000 mg total) by mouth 2 (two) times daily after a meal. 180 capsule 1  . Diclofenac Sodium (PENNSAID) 2 % SOLN Place 1 application onto the skin 2 (two) times daily at 10 AM and 5 PM. (Patient not taking: Reported on 02/01/2020) 2 g 0  . miconazole (MICOTIN) 2 % powder Apply  topically as needed for itching. (Patient not taking: Reported on 02/01/2020) 70 g 0  . pravastatin (PRAVACHOL) 40 MG tablet Take 1 tablet (40 mg total) by mouth at bedtime. (Patient not taking: Reported on 02/01/2020) 90 tablet 1   No current facility-administered medications on file prior to visit.    No Known Allergies  Family History  Problem Relation Age of Onset  . Hypertension Father   . Cerebral aneurysm Father   . Heart disease Father   . Stroke Brother   . Heart disease Brother   . Cancer Maternal Aunt   . Cancer Maternal Uncle   . Asthma Maternal Grandmother   . Asthma Maternal Grandfather   . Cancer Mother 53       breast and colon cancer  . Thyroid disease Neg Hx     BP 118/70   Pulse 91   Ht 5' 7.5" (1.715 m)   Wt 157 lb 9.6 oz (71.5 kg)   SpO2 99%   BMI 24.32 kg/m     Review of Systems denies palpitations, sob,  muscle weakness, excessive diaphoresis, and heat intolerance.       Objective:   Physical Exam VS: see vs page GEN: no distress HEAD: head: no deformity eyes: no periorbital swelling, no proptosis external nose and ears are normal NECK: thyroid is slightly and diffusely enlarged CHEST WALL: no deformity LUNGS: clear to auscultation CV: reg rate and rhythm, no murmur.  MUSCULOSKELETAL: gait is normal and steady EXTEMITIES: no deformity.  no leg edema NEURO:  cn 2-12 grossly intact.   readily moves all 4's.  sensation is intact to touch on all 4's.  No tremor.   SKIN:  Normal texture and temperature.  No rash or suspicious lesion is visible.  Not diaphoretic.  NODES:  None palpable at the neck PSYCH: alert, well-oriented.  Does not appear anxious nor depressed.    Lab Results  Component Value Date   TSH 0.22 (L) 02/06/2020   T4TOTAL 7.4 02/06/2020   I have reviewed outside records, and summarized: Pt was noted to have suppressed TSH, and referred here.  Main prob addressed was tobacco use      Assessment & Plan:  Hyperthyroidism, new to me.  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  Pt requests [place]   Patient Instructions  Let's check the ultrasound.  you will receive a phone call, about a day and time for an appointment. If there are nodules, the next step is to check a nuclear medicine scan. Please also consider the treatment options we discussed, and let me know.      Hyperthyroidism  Hyperthyroidism is when the thyroid gland is too active (overactive). The thyroid gland is a small gland located in the lower front part of the neck, just in front of the windpipe (trachea). This gland makes hormones that help control how the body uses food for energy (metabolism) as well as how the heart and brain function. These hormones also play a role in keeping your bones strong. When the thyroid is overactive, it produces too much of a hormone called thyroxine. What are the causes? This  condition may be caused by:  Graves' disease. This is a disorder in which the body's disease-fighting system (immune system) attacks the thyroid gland. This is the most common cause.  Inflammation of the thyroid gland.  A tumor in the thyroid gland.  Use of certain medicines, including: ? Prescription thyroid hormone replacement. ? Herbal supplements that mimic thyroid hormones. ?  Amiodarone therapy.  Solid or fluid-filled lumps within your thyroid gland (thyroid nodules).  Taking in a large amount of iodine from foods or medicines. What increases the risk? You are more likely to develop this condition if:  You are male.  You have a family history of thyroid conditions.  You smoke tobacco.  You use a medicine called lithium.  You take medicines that affect the immune system (immunosuppressants). What are the signs or symptoms? Symptoms of this condition include:  Nervousness.  Inability to tolerate heat.  Unexplained weight loss.  Diarrhea.  Change in the texture of hair or skin.  Heart skipping beats or making extra beats.  Rapid heart rate.  Loss of menstruation.  Shaky hands.  Fatigue.  Restlessness.  Sleep problems.  Enlarged thyroid gland or a lump in the thyroid (nodule). You may also have symptoms of Graves' disease, which may include:  Protruding eyes.  Dry eyes.  Red or swollen eyes.  Problems with vision. How is this diagnosed? This condition may be diagnosed based on:  Your symptoms and medical history.  A physical exam.  Blood tests.  Thyroid ultrasound. This test involves using sound waves to produce images of the thyroid gland.  A thyroid scan. A radioactive substance is injected into a vein, and images show how much iodine is present in the thyroid.  Radioactive iodine uptake test (RAIU). A small amount of radioactive iodine is given by mouth to see how much iodine the thyroid absorbs after a certain amount of time. How  is this treated? Treatment depends on the cause and severity of the condition. Treatment may include:  Medicines to reduce the amount of thyroid hormone your body makes.  Radioactive iodine treatment (radioiodine therapy). This involves swallowing a small dose of radioactive iodine, in capsule or liquid form, to kill thyroid cells.  Surgery to remove part or all of your thyroid gland. You may need to take thyroid hormone replacement medicine for the rest of your life after thyroid surgery.  Medicines to help manage your symptoms. Follow these instructions at home:   Take over-the-counter and prescription medicines only as told by your health care provider.  Do not use any products that contain nicotine or tobacco, such as cigarettes and e-cigarettes. If you need help quitting, ask your health care provider.  Follow any instructions from your health care provider about diet. You may be instructed to limit foods that contain iodine.  Keep all follow-up visits as told by your health care provider. This is important. ? You will need to have blood tests regularly so that your health care provider can monitor your condition. Contact a health care provider if:  Your symptoms do not get better with treatment.  You have a fever.  You are taking thyroid hormone replacement medicine and you: ? Have symptoms of depression. ? Feel like you are tired all the time. ? Gain weight. Get help right away if:  You have chest pain.  You have decreased alertness or a change in your awareness.  You have abdominal pain.  You feel dizzy.  You have a rapid heartbeat.  You have an irregular heartbeat.  You have difficulty breathing. Summary  The thyroid gland is a small gland located in the lower front part of the neck, just in front of the windpipe (trachea).  Hyperthyroidism is when the thyroid gland is too active (overactive) and produces too much of a hormone called thyroxine.  The most  common cause is Graves' disease,  a disorder in which your immune system attacks the thyroid gland.  Hyperthyroidism can cause various symptoms, such as unexplained weight loss, nervousness, inability to tolerate heat, or changes in your heartbeat.  Treatment may include medicine to reduce the amount of thyroid hormone your body makes, radioiodine therapy, surgery, or medicines to manage symptoms. This information is not intended to replace advice given to you by your health care provider. Make sure you discuss any questions you have with your health care provider. Document Revised: 03/26/2017 Document Reviewed: 03/24/2017 Elsevier Patient Education  2020 [employer].

## 2020-03-15 NOTE — Progress Notes (Signed)
0

## 2020-03-24 ENCOUNTER — Encounter: Payer: Self-pay | Admitting: Nurse Practitioner

## 2020-04-02 ENCOUNTER — Ambulatory Visit
Admission: RE | Admit: 2020-04-02 | Discharge: 2020-04-02 | Disposition: A | Discharge status: Home / Self Care | Payer: BC Managed Care – PPO | Source: Ambulatory Visit | Attending: Endocrinology | Admitting: Endocrinology

## 2020-04-02 DIAGNOSIS — E041 Nontoxic single thyroid nodule: Secondary | ICD-10-CM | POA: Diagnosis not present

## 2020-04-02 DIAGNOSIS — E0590 Thyrotoxicosis, unspecified without thyrotoxic crisis or storm: Secondary | ICD-10-CM | POA: Diagnosis not present

## 2020-04-23 DIAGNOSIS — Z20828 Contact with and (suspected) exposure to other viral communicable diseases: Secondary | ICD-10-CM | POA: Diagnosis not present

## 2020-05-01 DIAGNOSIS — Z20828 Contact with and (suspected) exposure to other viral communicable diseases: Secondary | ICD-10-CM | POA: Diagnosis not present

## 2020-05-08 ENCOUNTER — Other Ambulatory Visit: Payer: Self-pay

## 2020-05-08 ENCOUNTER — Encounter: Payer: Self-pay | Admitting: Gastroenterology

## 2020-05-08 ENCOUNTER — Ambulatory Visit (AMBULATORY_SURGERY_CENTER): Payer: Self-pay | Admitting: *Deleted

## 2020-05-08 VITALS — Ht 67.5 in | Wt 160.2 lb

## 2020-05-08 DIAGNOSIS — Z01818 Encounter for other preprocedural examination: Secondary | ICD-10-CM

## 2020-05-08 DIAGNOSIS — Z1211 Encounter for screening for malignant neoplasm of colon: Secondary | ICD-10-CM

## 2020-05-08 MED ORDER — PLENVU 140 G PO SOLR: 1.0000 | Freq: Once | ORAL | 0 refills | Status: AC

## 2020-05-08 NOTE — Progress Notes (Signed)
covid test 05-10-20 at 3:00 pm  Pt is aware that care partner will wait in the car during procedure; if they feel like they will be too hot or cold to wait in the car; they may wait in the 4 th floor lobby. Patient is aware to bring only one care partner. We want them to wear a mask (we do not have any that we can provide them), practice social distancing, and we will check their temperatures when they get here.  I did remind the patient that their care partner needs to stay in the parking lot the entire time and have a cell phone available, we will call them when the pt is ready for discharge. Patient will wear mask into building.  Denies trouble moving neck or fam hx/hx of malignant hyperthermia   No egg or soy allergy  No home oxygen use   No medications for weight loss taken  emmi information given  Pt denies constipation issues  Plenvu coupon given and code put into RX

## 2020-05-10 ENCOUNTER — Other Ambulatory Visit: Payer: Self-pay | Admitting: Gastroenterology

## 2020-05-10 DIAGNOSIS — Z1159 Encounter for screening for other viral diseases: Secondary | ICD-10-CM | POA: Diagnosis not present

## 2020-05-11 LAB — SARS CORONAVIRUS 2 (TAT 6-24 HRS): SARS Coronavirus 2: POSITIVE — AB

## 2020-05-14 ENCOUNTER — Encounter: Payer: BC Managed Care – PPO | Admitting: Gastroenterology

## 2020-05-28 ENCOUNTER — Encounter: Payer: Self-pay | Admitting: Gastroenterology

## 2020-05-28 ENCOUNTER — Other Ambulatory Visit: Payer: Self-pay

## 2020-05-28 ENCOUNTER — Ambulatory Visit (AMBULATORY_SURGERY_CENTER): Payer: BC Managed Care – PPO | Admitting: Gastroenterology

## 2020-05-28 VITALS — BP 104/76 | HR 58 | Temp 97.1°F | Resp 16

## 2020-05-28 DIAGNOSIS — Z1211 Encounter for screening for malignant neoplasm of colon: Secondary | ICD-10-CM | POA: Diagnosis not present

## 2020-05-28 DIAGNOSIS — D125 Benign neoplasm of sigmoid colon: Secondary | ICD-10-CM

## 2020-05-28 DIAGNOSIS — K635 Polyp of colon: Secondary | ICD-10-CM | POA: Diagnosis not present

## 2020-05-28 DIAGNOSIS — K5140 Inflammatory polyps of colon without complications: Secondary | ICD-10-CM

## 2020-05-28 DIAGNOSIS — D122 Benign neoplasm of ascending colon: Secondary | ICD-10-CM

## 2020-05-28 DIAGNOSIS — D123 Benign neoplasm of transverse colon: Secondary | ICD-10-CM

## 2020-05-28 DIAGNOSIS — D124 Benign neoplasm of descending colon: Secondary | ICD-10-CM

## 2020-05-28 MED ORDER — SODIUM CHLORIDE 0.9 % IV SOLN: 500.0000 mL | Freq: Once | INTRAVENOUS | Status: DC

## 2020-05-28 NOTE — Progress Notes (Signed)
Called to room to assist during endoscopic procedure.  Patient ID and intended procedure confirmed with present staff. Received instructions for my participation in the procedure from the performing physician.  

## 2020-05-28 NOTE — Patient Instructions (Signed)
Handouts provided on polyps and hemorrhoids.   YOU HAD AN ENDOSCOPIC PROCEDURE TODAY AT THE [facility]:   Refer to the procedure report that was given to you for any specific questions about what was found during the examination.  If the procedure report does not answer your questions, please call your gastroenterologist to clarify.  If you requested that your care partner not be given the details of your procedure findings, then the procedure report has been included in a sealed envelope for you to review at your convenience later.  YOU SHOULD EXPECT: Some feelings of bloating in the abdomen. Passage of more gas than usual.  Walking can help get rid of the air that was put into your GI tract during the procedure and reduce the bloating. If you had a lower endoscopy (such as a colonoscopy or flexible sigmoidoscopy) you may notice spotting of blood in your stool or on the toilet paper. If you underwent a bowel prep for your procedure, you may not have a normal bowel movement for a few days.  Please Note:  You might notice some irritation and congestion in your nose or some drainage.  This is from the oxygen used during your procedure.  There is no need for concern and it should clear up in a day or so.  SYMPTOMS TO REPORT IMMEDIATELY:  Following lower endoscopy (colonoscopy or flexible sigmoidoscopy):  Excessive amounts of blood in the stool  Significant tenderness or worsening of abdominal pains  Swelling of the abdomen that is new, acute  Fever of 100F or higher  For urgent or emergent issues, a gastroenterologist can be reached at any hour by calling [phone]. Do not use MyChart messaging for urgent concerns.    DIET:  We do recommend a small meal at first, but then you may proceed to your regular diet.  Drink plenty of fluids but you should avoid alcoholic beverages for 24 hours.  ACTIVITY:  You should plan to take it easy for the rest of today and you should NOT DRIVE  or use heavy machinery until tomorrow (because of the sedation medicines used during the test).    FOLLOW UP: Our staff will call the number listed on your records 48-72 hours following your procedure to check on you and address any questions or concerns that you may have regarding the information given to you following your procedure. If we do not reach you, we will leave a message.  We will attempt to reach you two times.  During this call, we will ask if you have developed any symptoms of COVID 19. If you develop any symptoms (ie: fever, flu-like symptoms, shortness of breath, cough etc.) before then, please call [phone].  If you test positive for Covid 19 in the 2 weeks post procedure, please call and report this information to us.    If any biopsies were taken you will be contacted by phone or by letter within the next 1-3 weeks.  Please call us at [phone] if you have not heard about the biopsies in 3 weeks.    SIGNATURES/CONFIDENTIALITY: You and/or your care partner have signed paperwork which will be entered into your electronic medical record.  These signatures attest to the fact that that the information above on your After Visit Summary has been reviewed and is understood.  Full responsibility of the confidentiality of this discharge information lies with you and/or your care-partner.  

## 2020-05-28 NOTE — Progress Notes (Signed)
C.W. vital signs. 

## 2020-05-28 NOTE — Progress Notes (Signed)
A and O x3. Report to RN. Tolerated MAC anesthesia well.

## 2020-05-28 NOTE — Progress Notes (Signed)
Pt's states no medical or surgical changes since previsit or office visit. 

## 2020-05-28 NOTE — Op Note (Signed)
[facility] Patient Name: Jonathon Harris Procedure Date: 05/28/2020 2:51 PM MRN: [id number] Endoscopist: Ladene Artist , MD Age: 45 Referring MD:  Date of Birth: April 19, 1976 Gender: Male Account #: [id number] Procedure:                Colonoscopy Indications:              Screening for colorectal malignant neoplasm Medicines:                Monitored Anesthesia Care Procedure:                Pre-Anesthesia Assessment:                           - Prior to the procedure, a History and Physical                            was performed, and patient medications and                            allergies were reviewed. The patient's tolerance of                            previous anesthesia was also reviewed. The risks                            and benefits of the procedure and the sedation                            options and risks were discussed with the patient.                            All questions were answered, and informed consent                            was obtained. Prior Anticoagulants: The patient has                            taken no previous anticoagulant or antiplatelet                            agents. ASA Grade Assessment: II - A patient with                            mild systemic disease. After reviewing the risks                            and benefits, the patient was deemed in                            satisfactory condition to undergo the procedure.                           After obtaining informed consent, the colonoscope  was passed under direct vision. Throughout the                            procedure, the patient's blood pressure, pulse, and                            oxygen saturations were monitored continuously. The                            Olympus [id number]) Colonoscope was                            introduced through the anus and advanced to the the                            cecum, identified by  appendiceal orifice and                            ileocecal valve. The ileocecal valve, appendiceal                            orifice, and rectum were photographed. The quality                            of the bowel preparation was good. The colonoscopy                            was performed without difficulty. The patient                            tolerated the procedure well. Scope In: 3:04:52 PM Scope Out: 3:29:00 PM Scope Withdrawal Time: 0 hours 20 minutes 33 seconds  Total Procedure Duration: 0 hours 24 minutes 8 seconds  Findings:                 The perianal and digital rectal examinations were                            normal.                           Eleven sessile polyps were found in the sigmoid                            colon (5), descending colon (4), transverse colon                            (1) and ascending colon (1). The polyps were 5 to 8                            mm in size. These polyps were removed with a cold                            snare. Resection and retrieval were complete.  Internal hemorrhoids were found during                            retroflexion. The hemorrhoids were small and Grade                            I (internal hemorrhoids that do not prolapse).                           The exam was otherwise without abnormality on                            direct and retroflexion views. Complications:            No immediate complications. Estimated blood loss:                            None. Estimated Blood Loss:     Estimated blood loss: none. Impression:               - Eleven 5 to 8 mm polyps in the sigmoid colon, in                            the descending colon, in the transverse colon and                            in the ascending colon, removed with a cold snare.                            Resected and retrieved.                           - Internal hemorrhoids.                           - The examination  was otherwise normal on direct                            and retroflexion views. Recommendation:           - Repeat colonoscopy after studies are complete for                            surveillance based on pathology results.                           - Patient has a contact number available for                            emergencies. The signs and symptoms of potential                            delayed complications were discussed with the                            patient. Return to normal activities  tomorrow.                            Written discharge instructions were provided to the                            patient.                           - Resume previous diet.                           - Continue present medications.                           - Await pathology results. Ladene Artist, MD 05/28/2020 3:32:46 PM This report has been signed electronically.

## 2020-05-30 ENCOUNTER — Telehealth: Payer: Self-pay

## 2020-05-30 ENCOUNTER — Telehealth: Payer: Self-pay | Admitting: *Deleted

## 2020-05-30 NOTE — Telephone Encounter (Signed)
1. Have you developed a fever since your procedure? no  2.   Have you had an respiratory symptoms (SOB or cough) since your procedure? no  3.   Have you tested positive for COVID 19 since your procedure no  4.   Have you had any family members/close contacts diagnosed with the COVID 19 since your procedure?  no   If yes to any of these questions please route to Joylene John, RN and Joella Prince, RN Follow up Call-  Call back number 05/28/2020  Post procedure Call Back phone  # [phone]  Permission to leave phone message Yes  Some recent data might be hidden     Patient questions:  Do you have a fever, pain , or abdominal swelling? No. Pain Score  0 *  Have you tolerated food without any problems? Yes.    Have you been able to return to your normal activities? Yes.    Do you have any questions about your discharge instructions: Diet   No. Medications  No. Follow up visit  No.  Do you have questions or concerns about your Care? No.  Actions: * If pain score is 4 or above: No action needed, pain <4.

## 2020-05-30 NOTE — Telephone Encounter (Signed)
Left message on follow up call. 

## 2020-06-19 ENCOUNTER — Encounter: Payer: Self-pay | Admitting: Gastroenterology

## 2020-08-01 ENCOUNTER — Other Ambulatory Visit: Payer: Self-pay

## 2020-08-02 ENCOUNTER — Ambulatory Visit (INDEPENDENT_AMBULATORY_CARE_PROVIDER_SITE_OTHER): Payer: BC Managed Care – PPO | Admitting: Nurse Practitioner

## 2020-08-02 ENCOUNTER — Encounter: Payer: Self-pay | Admitting: Nurse Practitioner

## 2020-08-02 VITALS — BP 106/70 | HR 74 | Temp 97.5°F | Ht 68.0 in | Wt 162.6 lb

## 2020-08-02 DIAGNOSIS — E782 Mixed hyperlipidemia: Secondary | ICD-10-CM | POA: Diagnosis not present

## 2020-08-02 DIAGNOSIS — E0590 Thyrotoxicosis, unspecified without thyrotoxic crisis or storm: Secondary | ICD-10-CM

## 2020-08-02 DIAGNOSIS — R7303 Prediabetes: Secondary | ICD-10-CM | POA: Diagnosis not present

## 2020-08-02 DIAGNOSIS — Z87891 Personal history of nicotine dependence: Secondary | ICD-10-CM

## 2020-08-02 DIAGNOSIS — K635 Polyp of colon: Secondary | ICD-10-CM

## 2020-08-02 NOTE — Assessment & Plan Note (Addendum)
No tobacco use BP at goal Repeat lipid panel: Normal HgbA1c at 5.6 Abnormal lipid panel: elevated LDL and TC. At this time your risk of cardiovascular disease over the next 13yrs is 5.3%. this is considered low, so there is no need for cholesterol medication at this time. Continue DASH diet, regular exercise and eliminate nicotine products. Will repeat in 1year (fasting)

## 2020-08-02 NOTE — Progress Notes (Signed)
Subjective:  Patient ID: Jonathon Harris, male    DOB: 1976-02-25  Age: 45 y.o. MRN: [id number]  CC: Follow-up (6 month f/u on hyperlipidemia. Pt is fasting. )  HPI  Hyperthyroidism No weight loss, hoarseness, change in appetite, no constipation or diarrhea, no palpitations, no tremor, no change in vision. Thyroid US completed: Mildly heterogeneous and borderline enlarged thyroid without worrisome nodule or mass. Solitary punctate (approximately 0.9 cm) minimally complex right-sided thyroid cyst does not meet criteria to recommend percutaneous sampling or continued dedicated follow-up.  Repeat TSh annually  History of tobacco use Quit 74months ago  Hyperlipidemia No tobacco use BP at goal Repeat lipid panel: Normal HgbA1c at 5.6 Abnormal lipid panel: elevated LDL and TC. At this time your risk of cardiovascular disease over the next 93yrs is 5.3%. this is considered low, so there is no need for cholesterol medication at this time. Continue DASH diet, regular exercise and eliminate nicotine products. Will repeat in 1year (fasting)  Prediabetes Controlled with diet and exercise  Benign colon polyp After consultation with Dr. Fuller Plan, needs repeat colonoscopy in 87yrs due to FHx of colon cancer (mother at age 4)  Reviewed past Medical, Social and Family history today.  Outpatient Medications Prior to Visit  Medication Sig Dispense Refill  . B Complex-C-Folic Acid (HM SUPER VITAMIN B COMPLEX/C) TABS daily.    . Cholecalciferol (VITAMIN D) 2000 units tablet Take 1 tablet (2,000 Units total) by mouth daily. 90 tablet 1  . diphenhydrAMINE HCl, Sleep, 25 MG CAPS daily as needed.    . IBUPROFEN PO Take by mouth. PRN    . NON FORMULARY Manuka Honey with Zinc and Turmeric daily    . Omega-3 1000 MG CAPS Take 1 capsule (1,000 mg total) by mouth 2 (two) times daily after a meal. 180 capsule 1  . TURMERIC PO Take by mouth. 500 mg three times weekly    . Diclofenac Sodium (PENNSAID) 2 %  SOLN Place 1 application onto the skin 2 (two) times daily at 10 AM and 5 PM. (Patient not taking: Reported on 02/01/2020) 2 g 0  . miconazole (MICOTIN) 2 % powder Apply topically as needed for itching. (Patient not taking: Reported on 02/01/2020) 70 g 0  . pravastatin (PRAVACHOL) 40 MG tablet Take 1 tablet (40 mg total) by mouth at bedtime. (Patient not taking: Reported on 02/01/2020) 90 tablet 1   No facility-administered medications prior to visit.    ROS See HPI  Objective:  BP 106/70 (BP Location: Left Arm, Patient Position: Sitting, Cuff Size: Normal)   Pulse 74   Temp (!) 97.5 F (36.4 C) (Temporal)   Ht 5\' 8"  (1.727 m)   Wt 162 lb 9.6 oz (73.8 kg)   SpO2 98%   BMI 24.72 kg/m   Wt Readings from Last 3 Encounters:  08/02/20 162 lb 9.6 oz (73.8 kg)  05/08/20 160 lb 3.2 oz (72.7 kg)  03/15/20 157 lb 9.6 oz (71.5 kg)   Physical Exam Cardiovascular:     Rate and Rhythm: Normal rate.     Pulses: Normal pulses.  Pulmonary:     Effort: Pulmonary effort is normal.  Neurological:     Mental Status: He is alert and oriented to person, place, and time.    Assessment & Plan:  This visit occurred during the SARS-CoV-2 public health emergency.  Safety protocols were in place, including screening questions prior to the visit, additional usage of staff PPE, and extensive cleaning of exam room while observing  appropriate contact time as indicated for disinfecting solutions.   Jonathon Harris was seen today for follow-up.  Diagnoses and all orders for this visit:  Mixed hyperlipidemia -     Cancel: Lipid panel -     Lipid panel  Prediabetes -     Cancel: Hemoglobin A1c -     Hemoglobin A1c  Hyperthyroidism  History of tobacco use  Benign colon polyp   Problem List Items Addressed This Visit      Digestive   Benign colon polyp    After consultation with Dr. Fuller Plan, needs repeat colonoscopy in 15yrs due to FHx of colon cancer (mother at age 45)        Endocrine   Hyperthyroidism     No weight loss, hoarseness, change in appetite, no constipation or diarrhea, no palpitations, no tremor, no change in vision. Thyroid US completed: Mildly heterogeneous and borderline enlarged thyroid without worrisome nodule or mass. Solitary punctate (approximately 0.9 cm) minimally complex right-sided thyroid cyst does not meet criteria to recommend percutaneous sampling or continued dedicated follow-up.  Repeat TSh annually        Other   History of tobacco use    Quit 40months ago      Hyperlipidemia - Primary    No tobacco use BP at goal Repeat lipid panel: Normal HgbA1c at 5.6 Abnormal lipid panel: elevated LDL and TC. At this time your risk of cardiovascular disease over the next 23yrs is 5.3%. this is considered low, so there is no need for cholesterol medication at this time. Continue DASH diet, regular exercise and eliminate nicotine products. Will repeat in 1year (fasting)      Relevant Orders   Lipid panel (Completed)   Prediabetes    Controlled with diet and exercise      Relevant Orders   Hemoglobin A1c (Completed)      Follow-up: Return in about 6 months (around 02/01/2021) for CPE (fasting).  Wilfred Lacy, NP

## 2020-08-02 NOTE — Assessment & Plan Note (Signed)
No weight loss, hoarseness, change in appetite, no constipation or diarrhea, no palpitations, no tremor, no change in vision. Thyroid US completed: Mildly heterogeneous and borderline enlarged thyroid without worrisome nodule or mass. Solitary punctate (approximately 0.9 cm) minimally complex right-sided thyroid cyst does not meet criteria to recommend percutaneous sampling or continued dedicated follow-up.  Repeat TSh annually

## 2020-08-02 NOTE — Assessment & Plan Note (Signed)
Quit 18months ago

## 2020-08-03 ENCOUNTER — Telehealth: Payer: Self-pay | Admitting: Nurse Practitioner

## 2020-08-03 LAB — LIPID PANEL
Cholesterol: 205 mg/dL — ABNORMAL HIGH (ref <200)
HDL: 44 mg/dL (ref >=40)
LDL Cholesterol (Calc): 139 mg/dL (calc) — ABNORMAL HIGH
Non-HDL Cholesterol (Calc): 161 mg/dL (calc) — ABNORMAL HIGH (ref <130)
Total CHOL/HDL Ratio: 4.7 (calc) (ref <5.0)
Triglycerides: 104 mg/dL (ref <150)

## 2020-08-03 LAB — HEMOGLOBIN A1C
Hgb A1c MFr Bld: 5.6 % of total Hgb (ref <5.7)
Mean Plasma Glucose: 114 mg/dL
eAG (mmol/L): 6.3 mmol/L

## 2020-08-03 NOTE — Telephone Encounter (Signed)
-----   Message from Ladene Artist, MD sent at 08/02/2020  4:29 PM EDT ----- Regarding: RE: repeat colonoscopy Hi Anel Creighton, The polyps were benign and no polyps were precancerous. At the time of his colonoscopy we were not aware of his family history of colon cancer. A 5 year interval colonoscopy in 05/2025 is recommended. Thank you, Carlota Raspberry, please change his colonoscopy recall to 05/2025. MS  ----- Message ----- From: Flossie Buffy, NP Sent: 08/02/2020   3:38 PM EDT To: Ladene Artist, MD Subject: repeat colonoscopy                             Good Afternoon, This patient asked me when he needs to repeat his colonoscopy. With his family history and number of polyps removed during his recent scope; will it be 69yrs or 27yrs? Thank you  Wilfred Lacy, NP

## 2020-08-03 NOTE — Assessment & Plan Note (Signed)
Controlled with diet and exercise 

## 2020-08-03 NOTE — Assessment & Plan Note (Signed)
After consultation with Dr. Fuller Plan, needs repeat colonoscopy in 78yrs due to FHx of colon cancer (mother at age 46)

## 2020-08-06 NOTE — Telephone Encounter (Signed)
Patient notified and verbalized understanding. 

## 2021-01-31 ENCOUNTER — Encounter: Payer: BC Managed Care – PPO | Admitting: Nurse Practitioner

## 2021-02-07 ENCOUNTER — Ambulatory Visit (INDEPENDENT_AMBULATORY_CARE_PROVIDER_SITE_OTHER): Payer: Self-pay | Admitting: Nurse Practitioner

## 2021-02-07 ENCOUNTER — Encounter: Payer: Self-pay | Admitting: Nurse Practitioner

## 2021-02-07 ENCOUNTER — Other Ambulatory Visit: Payer: Self-pay

## 2021-02-07 VITALS — BP 138/84 | HR 78 | Temp 97.2°F | Resp 18 | Wt 164.8 lb

## 2021-02-07 DIAGNOSIS — Z8639 Personal history of other endocrine, nutritional and metabolic disease: Secondary | ICD-10-CM

## 2021-02-07 DIAGNOSIS — E782 Mixed hyperlipidemia: Secondary | ICD-10-CM

## 2021-02-07 DIAGNOSIS — M5412 Radiculopathy, cervical region: Secondary | ICD-10-CM

## 2021-02-07 DIAGNOSIS — Z125 Encounter for screening for malignant neoplasm of prostate: Secondary | ICD-10-CM

## 2021-02-07 DIAGNOSIS — R7303 Prediabetes: Secondary | ICD-10-CM

## 2021-02-07 DIAGNOSIS — Z0000 Encounter for general adult medical examination without abnormal findings: Secondary | ICD-10-CM

## 2021-02-07 DIAGNOSIS — M5410 Radiculopathy, site unspecified: Secondary | ICD-10-CM | POA: Insufficient documentation

## 2021-02-07 DIAGNOSIS — Z0001 Encounter for general adult medical examination with abnormal findings: Secondary | ICD-10-CM

## 2021-02-07 NOTE — Progress Notes (Signed)
Subjective:    Patient ID: Jonathon Harris, male    DOB: Jun 18, 1975, 45 y.o.   MRN: [id number]  Patient presents today for CPE and chronic conditions  HPI Hyperlipidemia Continue heart healthy diet and exercise He has minimized ETOH (socially, 1x/month, tobacco (socially with ETOH use) and marijuana use) Repeat lipid panel today  Prediabetes Repeat hgbA1c  History of hyperthyroidism Repeat TSH and T4 Stable weight, no palpitations, no heat/cold intolerance, no tremor, no mood swings  Radiculopathy intermittent left arm numbness if he sleep on left side, resolves when he repositions. No residual muscle weakness or atrophy. Advised to change sleeping pillow and start neck stretch exercise.  Vision:up to date Dental:up to date Diet:heart healthy Exercise:walking Weight:  Wt Readings from Last 3 Encounters:  02/07/21 164 lb 12.8 oz (74.8 kg)  08/02/20 162 lb 9.6 oz (73.8 kg)  05/08/20 160 lb 3.2 oz (72.7 kg)    Sexual History (orientation,birth control, marital status, STD):no change in GI/Gu function, up to date with colonoscopy, agreed to PSA check, no need for STD screen  Depression/Suicide: Depression screen [facility] 2/9 02/07/2021 02/01/2020 11/09/2016 02/10/2016  Decreased Interest 0 1 0 0  Down, Depressed, Hopeless 0 1 0 0  PHQ - 2 Score 0 2 0 0  Altered sleeping - 2 - -  Tired, decreased energy - 1 - -  Change in appetite - 0 - -  Feeling bad or failure about yourself  - 0 - -  Trouble concentrating - 0 - -  Moving slowly or fidgety/restless - 0 - -  Suicidal thoughts - 0 - -  PHQ-9 Score - 5 - -  Difficult doing work/chores - Not difficult at all - -   Immunizations: (TDAP, Hep C screen, Pneumovax, Influenza, zoster)  Health Maintenance  Topic Date Due   HIV Screening  Never done   Hepatitis C Screening: USPSTF Recommendation to screen - Ages 88-79 yo.  Never done   COVID-19 Vaccine (1) 02/23/2021*   Flu Shot  07/25/2021*   Colon Cancer Screening  05/28/2025    Tetanus Vaccine  02/09/2026   HPV Vaccine  Aged Out  *Topic was postponed. The date shown is not the original due date.   Fall Risk: Fall Risk  02/07/2021 11/09/2016 02/10/2016  Falls in the past year? 0 No No  Number falls in past yr: 0 - -  Injury with Fall? 0 - -   Advanced Directive: Advanced Directives 02/10/2016  Does Patient Have a Medical Advance Directive? No  Would patient like information on creating a medical advance directive? No - patient declined information    Medications and allergies reviewed with patient and updated if appropriate.  Patient Active Problem List   Diagnosis Date Noted   Radiculopathy 02/07/2021   Benign colon polyp 08/02/2020   History of hyperthyroidism 03/15/2020   Prediabetes 02/09/2020   Family hx of colon cancer 02/01/2020   History of tobacco use 02/01/2020   Elevated BP without diagnosis of hypertension 05/14/2016   Hyperlipidemia 05/12/2016   Chronic bilateral back pain 02/10/2016   Current Outpatient Medications on File Prior to Visit  Medication Sig Dispense Refill   B Complex-C-Folic Acid (HM SUPER VITAMIN B COMPLEX/C) TABS daily.     Cholecalciferol (VITAMIN D) 2000 units tablet Take 1 tablet (2,000 Units total) by mouth daily. 90 tablet 1   diphenhydrAMINE HCl, Sleep, 25 MG CAPS daily as needed.     IBUPROFEN PO Take by mouth. PRN     NON  FORMULARY Manuka Honey with Zinc and Turmeric daily     Omega-3 1000 MG CAPS Take 1 capsule (1,000 mg total) by mouth 2 (two) times daily after a meal. 180 capsule 1   TURMERIC PO Take by mouth. 500 mg three times weekly     No current facility-administered medications on file prior to visit.   Past Medical History:  Diagnosis Date   Thyroid disease    hyperactive- no medications at this time   Past Surgical History:  Procedure Laterality Date   CYST EXCISION     chest   Social History   Socioeconomic History   Marital status: Married    Spouse name: Not on file   Number of  children: Not on file   Years of education: Not on file   Highest education level: Not on file  Occupational History   Not on file  Tobacco Use   Smoking status: Former    Packs/day: 0.25    Types: Cigarettes   Smokeless tobacco: Former    Quit date: 02/02/2020  Vaping Use   Vaping Use: Former   Substances: Nicotine, Nicotine-salt  Substance and Sexual Activity   Alcohol use: Yes    Alcohol/week: 2.0 standard drinks    Types: 2 Shots of liquor per week   Drug use: Yes    Frequency: 7.0 times per week    Types: Marijuana    Comment: uses several times a week   Sexual activity: Yes    Birth control/protection: None  Other Topics Concern   Not on file  Social History Narrative   Not on file   Social Determinants of Health   Financial Resource Strain: Not on file  Food Insecurity: Not on file  Transportation Needs: Not on file  Physical Activity: Not on file  Stress: Not on file  Social Connections: Not on file    Family History  Problem Relation Age of Onset   Hypertension Father    Cerebral aneurysm Father    Heart disease Father    Stroke Brother    Heart disease Brother    Cancer Maternal Aunt    Cancer Maternal Uncle    Asthma Maternal Grandmother    Asthma Maternal Grandfather    Cancer Mother 48       breast and colon cancer   Colon cancer Mother    Thyroid disease Neg Hx    Esophageal cancer Neg Hx    Stomach cancer Neg Hx    Rectal cancer Neg Hx        Review of Systems  Constitutional:  Negative for fever, malaise/fatigue and weight loss.  HENT:  Negative for congestion and sore throat.   Eyes:        Negative for visual changes  Respiratory:  Negative for cough and shortness of breath.   Cardiovascular:  Negative for chest pain, palpitations and leg swelling.  Gastrointestinal:  Negative for blood in stool, constipation, diarrhea and heartburn.  Genitourinary:  Negative for dysuria, frequency and urgency.  Musculoskeletal:  Negative for back  pain, falls, joint pain, myalgias and neck pain.  Skin:  Negative for rash.  Neurological:  Positive for tingling. Negative for dizziness, sensory change, focal weakness, weakness and headaches.  Endo/Heme/Allergies:  Does not bruise/bleed easily.  Psychiatric/Behavioral:  Negative for depression, hallucinations, memory loss, substance abuse and suicidal ideas. The patient is not nervous/anxious and does not have insomnia.    Objective:   Vitals:   02/07/21 1306  BP: 138/84  Pulse: 78  Resp: 18  Temp: (!) 97.2 F (36.2 C)  SpO2: 97%   Body mass index is 25.06 kg/m.  Physical Examination:  Physical Exam Vitals reviewed.  Constitutional:      General: He is not in acute distress.    Appearance: He is well-developed.  HENT:     Right Ear: Tympanic membrane, ear canal and external ear normal.     Left Ear: Tympanic membrane, ear canal and external ear normal.     Mouth/Throat:     Pharynx: No oropharyngeal exudate.  Eyes:     Extraocular Movements: Extraocular movements intact.     Conjunctiva/sclera: Conjunctivae normal.  Cardiovascular:     Rate and Rhythm: Normal rate and regular rhythm.     Heart sounds: Normal heart sounds.  Pulmonary:     Effort: Pulmonary effort is normal. No respiratory distress.     Breath sounds: Normal breath sounds.  Chest:     Chest wall: No tenderness.  Abdominal:     General: Bowel sounds are normal.     Palpations: Abdomen is soft.  Musculoskeletal:        General: Normal range of motion.     Cervical back: Normal range of motion and neck supple.     Right lower leg: No edema.     Left lower leg: No edema.  Skin:    General: Skin is warm and dry.  Neurological:     Mental Status: He is alert and oriented to person, place, and time.     Cranial Nerves: No cranial nerve deficit.     Motor: No weakness.     Gait: Gait normal.     Deep Tendon Reflexes: Reflexes are normal and symmetric. Reflexes normal.  Psychiatric:        Mood and  Affect: Mood normal.        Behavior: Behavior normal.        Thought Content: Thought content normal.    ASSESSMENT and PLAN: This visit occurred during the SARS-CoV-2 public health emergency.  Safety protocols were in place, including screening questions prior to the visit, additional usage of staff PPE, and extensive cleaning of exam room while observing appropriate contact time as indicated for disinfecting solutions.   Jonathon Harris was seen today for annual exam.  Diagnoses and all orders for this visit:  Encounter for preventative adult health care exam with abnormal findings -     Cancel: Comprehensive metabolic panel -     Cancel: PSA -     Cancel: CBC -     CBC -     Comprehensive metabolic panel -     PSA  Prediabetes -     Cancel: Hemoglobin A1c -     Hemoglobin A1c  Mixed hyperlipidemia -     Cancel: Lipid panel -     Lipid panel  Cervical radiculopathy  History of hyperthyroidism -     Cancel: TSH -     Cancel: T4, free -     T4, free -     TSH  Prostate cancer screening -     Cancel: PSA -     PSA     Problem List Items Addressed This Visit       Nervous and Auditory   Radiculopathy    intermittent left arm numbness if he sleep on left side, resolves when he repositions. No residual muscle weakness or atrophy. Advised to change sleeping pillow and start neck stretch exercise.  Other   History of hyperthyroidism    Repeat TSH and T4 Stable weight, no palpitations, no heat/cold intolerance, no tremor, no mood swings      Relevant Orders   T4, free   TSH   Hyperlipidemia    Continue heart healthy diet and exercise He has minimized ETOH (socially, 1x/month, tobacco (socially with ETOH use) and marijuana use) Repeat lipid panel today      Relevant Orders   Lipid panel   Prediabetes    Repeat hgbA1c      Relevant Orders   Hemoglobin A1c   Other Visit Diagnoses     Encounter for preventative adult health care exam with abnormal  findings    -  Primary   Relevant Orders   CBC   Comprehensive metabolic panel   PSA   Prostate cancer screening       Relevant Orders   PSA       Follow up: Return in about 6 months (around 08/08/2021) for hyperlipidemia and prediabetes.  Wilfred Lacy, NP

## 2021-02-07 NOTE — Assessment & Plan Note (Signed)
Continue heart healthy diet and exercise He has minimized ETOH (socially, 1x/month, tobacco (socially with ETOH use) and marijuana use) Repeat lipid panel today

## 2021-02-07 NOTE — Patient Instructions (Addendum)
Go to lab for blood draw  Start neck exercises  Cervical Radiculopathy Cervical radiculopathy means that a nerve in the neck (a cervical nerve) is pinched or bruised. This can happen because of an injury to the cervical spine (vertebrae) in the neck, or as a normal part of getting older. This can cause pain or loss of feeling (numbness) that runs from your neck all the way down to your arm and fingers. Often, this condition gets better with rest. Treatment may be needed if the condition does not get better. What are the causes? A neck injury. A bulging disk in your spine. Muscle movements that you cannot control (muscle spasms). Tight muscles in your neck due to overuse. Arthritis. Breakdown in the bones and joints of the spine (spondylosis) due to getting older. Bone spurs that form near the nerves in the neck. What are the signs or symptoms? Pain. The pain may: Run from the neck to the arm and hand. Be very bad or irritating. Be worse when you move your neck. Loss of feeling or tingling in your arm or hand. Weakness in your arm or hand, in very bad cases. How is this treated? In many cases, treatment is not needed for this condition. With rest, the condition often gets better over time. If treatment is needed, options may include: Wearing a soft neck collar (cervical collar) for short periods of time, as told by your doctor. Doing exercises (physical therapy) to strengthen your neck muscles. Taking medicines. Having shots (injections) in your spine, in very bad cases. Having surgery. This may be needed if other treatments do not help. The type of surgery that is used depends on the cause of your condition. Follow these instructions at home: If you have a soft neck collar: Wear it as told by your doctor. Remove it only as told by your doctor. Ask your doctor if you can remove the collar for cleaning and bathing. If you are allowed to remove the collar for cleaning or bathing: Follow  instructions from your doctor about how to remove the collar safely. Clean the collar by wiping it with mild soap and water and drying it completely. Take out any removable pads in the collar every 1-2 days. Wash them by hand with soap and water. Let them air-dry completely before you put them back in the collar. Check your skin under the collar for redness or sores. If you see any, tell your doctor. Managing pain   Take over-the-counter and prescription medicines only as told by your doctor. If told, put ice on the painful area. If you have a soft neck collar, remove it as told by your doctor. Put ice in a plastic bag. Place a towel between your skin and the bag. Leave the ice on for 20 minutes, 2-3 times a day. If using ice does not help, you can try using heat. Use the heat source that your doctor recommends, such as a moist heat pack or a heating pad. Place a towel between your skin and the heat source. Leave the heat on for 20-30 minutes. Remove the heat if your skin turns bright red. This is very important if you are unable to feel pain, heat, or cold. You may have a greater risk of getting burned. You may try a gentle neck and shoulder rub (massage). Activity Rest as needed. Return to your normal activities as told by your doctor. Ask your doctor what activities are safe for you. Do exercises as told by your  doctor or physical therapist. Do not lift anything that is heavier than 10 lb (4.5 kg) until your doctor tells you that it is safe. General instructions Use a flat pillow when you sleep. Do not drive while wearing a soft neck collar. If you do not have a soft neck collar, ask your doctor if it is safe to drive while your neck heals. Ask your doctor if the medicine prescribed to you requires you to avoid driving or using heavy machinery. Do not use any products that contain nicotine or tobacco, such as cigarettes, e-cigarettes, and chewing tobacco. These can delay healing. If you  need help quitting, ask your doctor. Keep all follow-up visits as told by your doctor. This is important. Contact a doctor if: Your condition does not get better with treatment. Get help right away if: Your pain gets worse and is not helped with medicine. You lose feeling or feel weak in your hand, arm, face, or leg. You have a high fever. You have a stiff neck. You cannot control when you poop or pee (have incontinence). You have trouble with walking, balance, or talking. Summary Cervical radiculopathy means that a nerve in the neck is pinched or bruised. A nerve can get pinched from a bulging disk, arthritis, an injury to the neck, or other causes. Symptoms include pain, tingling, or loss of feeling that goes from the neck into the arm or hand. Weakness in your arm or hand can happen in very bad cases. Treatment may include resting, wearing a soft neck collar, and doing exercises. You might need to take medicines for pain. In very bad cases, shots or surgery may be needed. This information is not intended to replace advice given to you by your health care provider. Make sure you discuss any questions you have with your health care provider. Document Revised: 03/04/2018 Document Reviewed: 03/04/2018 Neck Exercises Ask your health care provider which exercises are safe for you. Do exercises exactly as told by your health care provider and adjust them as directed. It is normal to feel mild stretching, pulling, tightness, or discomfort as you do these exercises. Stop right away if you feel sudden pain or your pain gets worse. Do not begin these exercises until told by your health care provider. Neck exercises can be important for many reasons. They can improve strength and maintain flexibility in your neck, which will help your upper back and prevent neck pain. Stretching exercises Rotation neck stretching  Sit in a chair or stand up. Place your feet flat on the floor, shoulder width  apart. Slowly turn your head (rotate) to the right until a slight stretch is felt. Turn it all the way to the right so you can look over your right shoulder. Do not tilt or tip your head. Hold this position for 10-30 seconds. Slowly turn your head (rotate) to the left until a slight stretch is felt. Turn it all the way to the left so you can look over your left shoulder. Do not tilt or tip your head. Hold this position for 10-30 seconds. Repeat ________5_ times. Complete this exercise _____2_____ times a day. Neck retraction Sit in a sturdy chair or stand up. Look straight ahead. Do not bend your neck. Use your fingers to push your chin backward (retraction). Do not bend your neck for this movement. Continue to face straight ahead. If you are doing the exercise properly, you will feel a slight sensation in your throat and a stretch at the back of  your neck. Hold the stretch for 1-2 seconds. Repeat ______5____ times. Complete this exercise _____2_____ times a day. Strengthening exercises Neck press Lie on your back on a firm bed or on the floor with a pillow under your head. Use your neck muscles to push your head down on the pillow and straighten your spine. Hold the position as well as you can. Keep your head facing up (in a neutral position) and your chin tucked. Slowly count to 5 while holding this position. Repeat ______5____ times. Complete this exercise _____2_____ times a day. Isometrics These are exercises in which you strengthen the muscles in your neck while keeping your neck still (isometrics). Sit in a supportive chair and place your hand on your forehead. Keep your head and face facing straight ahead. Do not flex or extend your neck while doing isometrics. Push forward with your head and neck while pushing back with your hand. Hold for 10 seconds. Do the sequence again, this time putting your hand against the back of your head. Use your head and neck to push backward against the  hand pressure. Finally, do the same exercise on either side of your head, pushing sideways against the pressure of your hand. Repeat ______5____ times. Complete this exercise ______2____ times a day. Prone head lifts Lie face-down (prone position), resting on your elbows so that your chest and upper back are raised. Start with your head facing downward, near your chest. Position your chin either on or near your chest. Slowly lift your head upward. Lift until you are looking straight ahead. Then continue lifting your head as far back as you can comfortably stretch. Hold your head up for 5 seconds. Then slowly lower it to your starting position. Repeat _____5_____ times. Complete this exercise _______2___ times a day. Supine head lifts Lie on your back (supine position), bending your knees to point to the ceiling and keeping your feet flat on the floor. Lift your head slowly off the floor, raising your chin toward your chest. Hold for 5 seconds. Repeat _____5_____ times. Complete this exercise ______2____ times a day. Scapular retraction Stand with your arms at your sides. Look straight ahead. Slowly pull both shoulders (scapulae) backward and downward (retraction) until you feel a stretch between your shoulder blades in your upper back. Hold for 10-30 seconds. Relax and repeat. Repeat ______5____ times. Complete this exercise ____2______ times a day. Contact a health care provider if: Your neck pain or discomfort gets much worse when you do an exercise. Your neck pain or discomfort does not improve within 2 hours after you exercise. If you have any of these problems, stop exercising right away. Do not do the exercises again unless your health care provider says that you can. Get help right away if: You develop sudden, severe neck pain. If this happens, stop exercising right away. Do not do the exercises again unless your health care provider says that you can. This information is not  intended to replace advice given to you by your health care provider. Make sure you discuss any questions you have with your health care provider. Document Revised: 02/09/2018 Document Reviewed: 02/09/2018 Elsevier Patient Education  2022 [employer] Patient Education  2022 [employer].

## 2021-02-07 NOTE — Assessment & Plan Note (Signed)
intermittent left arm numbness if he sleep on left side, resolves when he repositions. No residual muscle weakness or atrophy. Advised to change sleeping pillow and start neck stretch exercise.

## 2021-02-07 NOTE — Assessment & Plan Note (Signed)
Repeat TSH and T4 Stable weight, no palpitations, no heat/cold intolerance, no tremor, no mood swings

## 2021-02-07 NOTE — Assessment & Plan Note (Signed)
Repeat hgbA1c 

## 2021-02-08 LAB — HEMOGLOBIN A1C
Hgb A1c MFr Bld: 5.6 % of total Hgb (ref <5.7)
Mean Plasma Glucose: 114 mg/dL
eAG (mmol/L): 6.3 mmol/L

## 2021-02-08 LAB — CBC
HCT: 45.9 % (ref 38.5–50.0)
Hemoglobin: 15.2 g/dL (ref 13.2–17.1)
MCH: 29.6 pg (ref 27.0–33.0)
MCHC: 33.1 g/dL (ref 32.0–36.0)
MCV: 89.3 fL (ref 80.0–100.0)
MPV: 10.9 fL (ref 7.5–12.5)
Platelets: 235 10*3/uL (ref 140–400)
RBC: 5.14 10*6/uL (ref 4.20–5.80)
RDW: 13.9 % (ref 11.0–15.0)
WBC: 7.7 10*3/uL (ref 3.8–10.8)

## 2021-02-08 LAB — COMPREHENSIVE METABOLIC PANEL
AG Ratio: 1.6 (calc) (ref 1.0–2.5)
ALT: 12 U/L (ref 9–46)
AST: 15 U/L (ref 10–40)
Albumin: 4.1 g/dL (ref 3.6–5.1)
Alkaline phosphatase (APISO): 72 U/L (ref 36–130)
BUN: 13 mg/dL (ref 7–25)
CO2: 26 mmol/L (ref 20–32)
Calcium: 9.4 mg/dL (ref 8.6–10.3)
Chloride: 104 mmol/L (ref 98–110)
Creat: 1.11 mg/dL (ref 0.60–1.29)
Globulin: 2.6 g/dL (calc) (ref 1.9–3.7)
Glucose, Bld: 103 mg/dL — ABNORMAL HIGH (ref 65–99)
Potassium: 4.6 mmol/L (ref 3.5–5.3)
Sodium: 139 mmol/L (ref 135–146)
Total Bilirubin: 0.4 mg/dL (ref 0.2–1.2)
Total Protein: 6.7 g/dL (ref 6.1–8.1)

## 2021-02-08 LAB — TSH: TSH: 0.32 mIU/L — ABNORMAL LOW (ref 0.40–4.50)

## 2021-02-08 LAB — LIPID PANEL
Cholesterol: 224 mg/dL — ABNORMAL HIGH (ref <200)
HDL: 40 mg/dL (ref >=40)
LDL Cholesterol (Calc): 148 mg/dL (calc) — ABNORMAL HIGH
Non-HDL Cholesterol (Calc): 184 mg/dL (calc) — ABNORMAL HIGH (ref <130)
Total CHOL/HDL Ratio: 5.6 (calc) — ABNORMAL HIGH (ref <5.0)
Triglycerides: 217 mg/dL — ABNORMAL HIGH (ref <150)

## 2021-02-08 LAB — T4, FREE: Free T4: 1.2 ng/dL (ref 0.8–1.8)

## 2021-02-08 LAB — PSA: PSA: 1.77 ng/mL (ref <=4.00)

## 2021-02-11 NOTE — Addendum Note (Signed)
Addended by: Leana Gamer on: 02/11/2021 03:58 PM   Modules accepted: Orders

## 2022-04-27 DIAGNOSIS — Z419 Encounter for procedure for purposes other than remedying health state, unspecified: Secondary | ICD-10-CM | POA: Diagnosis not present

## 2022-05-08 ENCOUNTER — Encounter: Payer: Self-pay | Admitting: Nurse Practitioner

## 2022-05-08 ENCOUNTER — Ambulatory Visit (INDEPENDENT_AMBULATORY_CARE_PROVIDER_SITE_OTHER): Payer: Medicaid Other | Admitting: Nurse Practitioner

## 2022-05-08 VITALS — BP 122/82 | HR 76 | Temp 98.0°F | Ht 68.0 in | Wt 175.4 lb

## 2022-05-08 DIAGNOSIS — Z8639 Personal history of other endocrine, nutritional and metabolic disease: Secondary | ICD-10-CM | POA: Diagnosis not present

## 2022-05-08 DIAGNOSIS — R7989 Other specified abnormal findings of blood chemistry: Secondary | ICD-10-CM | POA: Diagnosis not present

## 2022-05-08 DIAGNOSIS — Z0001 Encounter for general adult medical examination with abnormal findings: Secondary | ICD-10-CM

## 2022-05-08 DIAGNOSIS — Z125 Encounter for screening for malignant neoplasm of prostate: Secondary | ICD-10-CM

## 2022-05-08 DIAGNOSIS — E782 Mixed hyperlipidemia: Secondary | ICD-10-CM | POA: Diagnosis not present

## 2022-05-08 DIAGNOSIS — R7303 Prediabetes: Secondary | ICD-10-CM | POA: Diagnosis not present

## 2022-05-08 DIAGNOSIS — K5140 Inflammatory polyps of colon without complications: Secondary | ICD-10-CM | POA: Insufficient documentation

## 2022-05-08 LAB — LIPID PANEL
Cholesterol: 229 mg/dL — ABNORMAL HIGH (ref 0–200)
HDL: 38.5 mg/dL — ABNORMAL LOW (ref >39.00)
LDL Cholesterol: 169 mg/dL — ABNORMAL HIGH (ref 0–99)
NonHDL: 190.4
Total CHOL/HDL Ratio: 6
Triglycerides: 107 mg/dL (ref 0.0–149.0)
VLDL: 21.4 mg/dL (ref 0.0–40.0)

## 2022-05-08 LAB — COMPREHENSIVE METABOLIC PANEL
ALT: 14 U/L (ref 0–53)
AST: 23 U/L (ref 0–37)
Albumin: 4.5 g/dL (ref 3.5–5.2)
Alkaline Phosphatase: 72 U/L (ref 39–117)
BUN: 19 mg/dL (ref 6–23)
CO2: 19 mEq/L (ref 19–32)
Calcium: 9.4 mg/dL (ref 8.4–10.5)
Chloride: 106 mEq/L (ref 96–112)
Creatinine, Ser: 1.12 mg/dL (ref 0.40–1.50)
GFR: 78.2 mL/min (ref >60.00)
Glucose, Bld: 102 mg/dL — ABNORMAL HIGH (ref 70–99)
Potassium: 3.9 mEq/L (ref 3.5–5.1)
Sodium: 137 mEq/L (ref 135–145)
Total Bilirubin: 0.5 mg/dL (ref 0.2–1.2)
Total Protein: 7.4 g/dL (ref 6.0–8.3)

## 2022-05-08 NOTE — Assessment & Plan Note (Signed)
Repeat hgbA1c 

## 2022-05-08 NOTE — Assessment & Plan Note (Signed)
Asymptomatic Repeat thyroid panel Wt Readings from Last 3 Encounters:  05/08/22 175 lb 6.4 oz (79.6 kg)  02/07/21 164 lb 12.8 oz (74.8 kg)  08/02/20 162 lb 9.6 oz (73.8 kg)

## 2022-05-08 NOTE — Progress Notes (Signed)
Complete physical exam  Patient: Jonathon Harris   DOB: Dec 24, 1975   47 y.o. Male  MRN: [id number] Visit Date: 05/08/2022  Subjective:    Chief Complaint  Patient presents with   Annual Exam    CPE Pt fasting     Jonathon Harris is a 47 y.o. male who presents today for a complete physical exam. He reports consuming a general diet.  No consistent exercise  He generally feels fairly well. He reports sleeping fairly well. He does not have additional problems to discuss today.  Vision:No Dental:No STD Screen:No PSA: yes  Most recent fall risk assessment:    02/07/2021    1:08 PM  [place] in the past year? 0  Number falls in past yr: 0  Injury with Fall? 0     Depression screen:Yes - No Depression  Most recent depression screenings:    05/08/2022   11:23 AM 02/07/2021    1:34 PM  PHQ 2/9 Scores  PHQ - 2 Score 1 0  PHQ- 9 Score 6     HPI  Low TSH level Asymptomatic Repeat thyroid panel Wt Readings from Last 3 Encounters:  05/08/22 175 lb 6.4 oz (79.6 kg)  02/07/21 164 lb 12.8 oz (74.8 kg)  08/02/20 162 lb 9.6 oz (73.8 kg)     Prediabetes Repeat hgbA1c  Hyperlipidemia Repeat lipid panel   Past Medical History:  Diagnosis Date   Elevated BP without diagnosis of hypertension 05/14/2016   Thyroid disease    hyperactive- no medications at this time   Past Surgical History:  Procedure Laterality Date   CYST EXCISION     chest   Social History   Socioeconomic History   Marital status: Married    Spouse name: Not on file   Number of children: 4   Years of education: Not on file   Highest education level: Not on file  Occupational History   Not on file  Tobacco Use   Smoking status: Former    Packs/day: 0.25    Types: Cigarettes   Smokeless tobacco: Former    Quit date: 02/02/2020  Vaping Use   Vaping Use: Former   Substances: Nicotine, Nicotine-salt  Substance and Sexual Activity   Alcohol use: Yes    Alcohol/week: 1.0 standard drink  of alcohol    Types: 1 Shots of liquor per week   Drug use: Yes    Frequency: 2.0 times per week    Types: Marijuana    Comment: uses several times a week   Sexual activity: Yes    Birth control/protection: None  Other Topics Concern   Not on file  Social History Narrative   Not on file   Social Determinants of Health   Financial Resource Strain: Not on file  Food Insecurity: Not on file  Transportation Needs: Not on file  Physical Activity: Not on file  Stress: Not on file  Social Connections: Not on file  Intimate Partner Violence: Not on file   Family Status  Relation Name Status   Father  Deceased       deceased at 45   Brother  (Not Specified)   Mat Aunt  (Not Specified)       lung and breast   Mat Uncle  (Not Specified)       bone cancer   MGM  (Not Specified)   MGF  (Not Specified)       prostate cancer, diagnosed in 66s  Mother  Alive   Neg Hx  (Not Specified)   Family History  Problem Relation Age of Onset   Hypertension Father    Cerebral aneurysm Father    Heart disease Father    Stroke Brother    Heart disease Brother    Cancer Maternal Aunt    Cancer Maternal Uncle    Asthma Maternal Grandmother    Asthma Maternal Grandfather    Cancer Mother 4       breast and colon cancer   Colon cancer Mother    Thyroid disease Neg Hx    Esophageal cancer Neg Hx    Stomach cancer Neg Hx    Rectal cancer Neg Hx    No Known Allergies  Patient Care Team: Jasmeet Gehl, Charlene Brooke, NP as PCP - General (Internal Medicine)   Medications: Outpatient Medications Prior to Visit  Medication Sig   B Complex-C-Folic Acid (HM SUPER VITAMIN B COMPLEX/C) TABS daily.   Cholecalciferol (VITAMIN D) 2000 units tablet Take 1 tablet (2,000 Units total) by mouth daily.   diphenhydrAMINE HCl, Sleep, 25 MG CAPS daily as needed.   IBUPROFEN PO Take by mouth. PRN   NON FORMULARY Manuka Honey with Zinc and Turmeric daily   Omega-3 1000 MG CAPS Take 1 capsule (1,000 mg total) by  mouth 2 (two) times daily after a meal.   TURMERIC PO Take by mouth. 500 mg three times weekly   No facility-administered medications prior to visit.    Review of Systems  Constitutional:  Negative for fever.  HENT:  Negative for congestion and sore throat.   Eyes:        Negative for visual changes  Respiratory:  Negative for cough and shortness of breath.   Cardiovascular:  Negative for chest pain, palpitations and leg swelling.  Gastrointestinal:  Negative for blood in stool, constipation and diarrhea.  Genitourinary:  Negative for dysuria, frequency and urgency.  Musculoskeletal:  Negative for myalgias.  Skin:  Negative for rash.  Neurological:  Negative for dizziness and headaches.  Hematological:  Does not bruise/bleed easily.  Psychiatric/Behavioral:  Negative for suicidal ideas. The patient is not nervous/anxious.         Objective:  BP 122/82 (BP Location: Right Arm, Patient Position: Sitting, Cuff Size: Small)   Pulse 76   Temp 98 F (36.7 C) (Temporal)   Ht '5\' 8"'$  (1.727 m)   Wt 175 lb 6.4 oz (79.6 kg)   SpO2 98%   BMI 26.67 kg/m     Physical Exam Vitals reviewed.  Constitutional:      General: He is not in acute distress.    Appearance: He is well-developed.  HENT:     Right Ear: Tympanic membrane, ear canal and external ear normal.     Left Ear: Tympanic membrane, ear canal and external ear normal.     Nose: Nose normal.  Eyes:     Extraocular Movements: Extraocular movements intact.     Conjunctiva/sclera: Conjunctivae normal.     Pupils: Pupils are equal, round, and reactive to light.  Cardiovascular:     Rate and Rhythm: Normal rate and regular rhythm.     Pulses: Normal pulses.     Heart sounds: Normal heart sounds.  Pulmonary:     Effort: Pulmonary effort is normal. No respiratory distress.     Breath sounds: Normal breath sounds.  Chest:     Chest wall: No tenderness.  Abdominal:     General: Bowel sounds are normal.  Palpations:  Abdomen is soft.  Musculoskeletal:        General: Normal range of motion.     Cervical back: Normal range of motion and neck supple.     Right lower leg: No edema.     Left lower leg: No edema.  Lymphadenopathy:     Cervical: No cervical adenopathy.  Skin:    General: Skin is warm and dry.  Neurological:     Mental Status: He is alert and oriented to person, place, and time.     Deep Tendon Reflexes: Reflexes are normal and symmetric.  Psychiatric:        Mood and Affect: Mood normal.        Behavior: Behavior normal.        Thought Content: Thought content normal.      No results found for any visits on 05/08/22.    Assessment & Plan:    Routine Health Maintenance and Physical Exam  Immunization History  Administered Date(s) Administered   Tdap 02/10/2016    Health Maintenance  Topic Date Due   HIV Screening  Never done   Hepatitis C Screening  Never done   COVID-19 Vaccine (1) 05/24/2022 (Originally 04/12/1976)   INFLUENZA VACCINE  07/26/2022 (Originally 11/25/2021)   COLONOSCOPY (Pts 45-53yr Insurance coverage will need to be confirmed)  05/28/2025   DTaP/Tdap/Td (2 - Td or Tdap) 02/09/2026   HPV VACCINES  Aged Out    Discussed health benefits of physical activity, and encouraged him to engage in regular exercise appropriate for his age and condition.  Problem List Items Addressed This Visit       Other   Hyperlipidemia    Repeat lipid panel      Relevant Orders   Lipid panel   Low TSH level    Asymptomatic Repeat thyroid panel Wt Readings from Last 3 Encounters:  05/08/22 175 lb 6.4 oz (79.6 kg)  02/07/21 164 lb 12.8 oz (74.8 kg)  08/02/20 162 lb 9.6 oz (73.8 kg)         Relevant Orders   Thyroid Panel With TSH   Prediabetes    Repeat hgbA1c      Other Visit Diagnoses     Encounter for preventative adult health care exam with abnormal findings    -  Primary   Relevant Orders   Comprehensive metabolic panel   Prostate cancer screening        Relevant Orders   PSA      Return in about 4 weeks (around 06/05/2022) for CPE (fasting).     CWilfred Lacy NP

## 2022-05-08 NOTE — Patient Instructions (Signed)
Go to lab  Preventive Care 40-48 Years Old, Male Preventive care refers to lifestyle choices and visits with your health care provider that can promote health and wellness. Preventive care visits are also called wellness exams. What can I expect for my preventive care visit? Counseling During your preventive care visit, your health care provider may ask about your: Medical history, including: Past medical problems. Family medical history. Current health, including: Emotional well-being. Home life and relationship well-being. Sexual activity. Lifestyle, including: Alcohol, nicotine or tobacco, and drug use. Access to firearms. Diet, exercise, and sleep habits. Safety issues such as seatbelt and bike helmet use. Sunscreen use. Work and work Statistician. Physical exam Your health care provider will check your: Height and weight. These may be used to calculate your BMI (body mass index). BMI is a measurement that tells if you are at a healthy weight. Waist circumference. This measures the distance around your waistline. This measurement also tells if you are at a healthy weight and may help predict your risk of certain diseases, such as type 2 diabetes and high blood pressure. Heart rate and blood pressure. Body temperature. Skin for abnormal spots. What immunizations do I need?  Vaccines are usually given at various ages, according to a schedule. Your health care provider will recommend vaccines for you based on your age, medical history, and lifestyle or other factors, such as travel or where you work. What tests do I need? Screening Your health care provider may recommend screening tests for certain conditions. This may include: Lipid and cholesterol levels. Diabetes screening. This is done by checking your blood sugar (glucose) after you have not eaten for a while (fasting). Hepatitis B test. Hepatitis C test. HIV (human immunodeficiency virus) test. STI (sexually transmitted  infection) testing, if you are at risk. Lung cancer screening. Prostate cancer screening. Colorectal cancer screening. Talk with your health care provider about your test results, treatment options, and if necessary, the need for more tests. Follow these instructions at home: Eating and drinking  Eat a diet that includes fresh fruits and vegetables, whole grains, lean protein, and low-fat dairy products. Take vitamin and mineral supplements as recommended by your health care provider. Do not drink alcohol if your health care provider tells you not to drink. If you drink alcohol: Limit how much you have to 0-2 drinks a day. Know how much alcohol is in your drink. In the [place] one drink equals one 12 oz bottle of beer (355 mL), one 5 oz glass of wine (148 mL), or one 1 oz glass of hard liquor (44 mL). Lifestyle Brush your teeth every morning and night with fluoride toothpaste. Floss one time each day. Exercise for at least 30 minutes 5 or more days each week. Do not use any products that contain nicotine or tobacco. These products include cigarettes, chewing tobacco, and vaping devices, such as e-cigarettes. If you need help quitting, ask your health care provider. Do not use drugs. If you are sexually active, practice safe sex. Use a condom or other form of protection to prevent STIs. Take aspirin only as told by your health care provider. Make sure that you understand how much to take and what form to take. Work with your health care provider to find out whether it is safe and beneficial for you to take aspirin daily. Find healthy ways to manage stress, such as: Meditation, yoga, or listening to music. Journaling. Talking to a trusted person. Spending time with friends and family. Minimize exposure to  UV radiation to reduce your risk of skin cancer. Safety Always wear your seat belt while driving or riding in a vehicle. Do not drive: If you have been drinking alcohol. Do not ride with  someone who has been drinking. When you are tired or distracted. While texting. If you have been using any mind-altering substances or drugs. Wear a helmet and other protective equipment during sports activities. If you have firearms in your house, make sure you follow all gun safety procedures. What's next? Go to your health care provider once a year for an annual wellness visit. Ask your health care provider how often you should have your eyes and teeth checked. Stay up to date on all vaccines. This information is not intended to replace advice given to you by your health care provider. Make sure you discuss any questions you have with your health care provider. Document Revised: 10/09/2020 Document Reviewed: 10/09/2020 Elsevier Patient Education  [employer].

## 2022-05-08 NOTE — Assessment & Plan Note (Signed)
Repeat lipid panel ?

## 2022-05-09 LAB — THYROID PANEL WITH TSH
Free Thyroxine Index: 2.4 (ref 1.4–3.8)
T3 Uptake: 33 % (ref 22–35)
T4, Total: 7.3 ug/dL (ref 4.9–10.5)
TSH: 0.34 mIU/L — ABNORMAL LOW (ref 0.40–4.50)

## 2022-05-12 ENCOUNTER — Ambulatory Visit (INDEPENDENT_AMBULATORY_CARE_PROVIDER_SITE_OTHER): Payer: Medicaid Other | Admitting: Nurse Practitioner

## 2022-05-12 ENCOUNTER — Ambulatory Visit (INDEPENDENT_AMBULATORY_CARE_PROVIDER_SITE_OTHER)
Admission: RE | Admit: 2022-05-12 | Discharge: 2022-05-12 | Disposition: A | Discharge status: Home / Self Care | Payer: Medicaid Other | Source: Ambulatory Visit | Attending: Nurse Practitioner | Admitting: Nurse Practitioner

## 2022-05-12 ENCOUNTER — Encounter: Payer: Self-pay | Admitting: Nurse Practitioner

## 2022-05-12 VITALS — BP 112/72 | HR 73 | Temp 98.3°F | Ht 68.0 in | Wt 176.0 lb

## 2022-05-12 DIAGNOSIS — M5412 Radiculopathy, cervical region: Secondary | ICD-10-CM | POA: Diagnosis not present

## 2022-05-12 DIAGNOSIS — M19019 Primary osteoarthritis, unspecified shoulder: Secondary | ICD-10-CM

## 2022-05-12 DIAGNOSIS — G8929 Other chronic pain: Secondary | ICD-10-CM

## 2022-05-12 DIAGNOSIS — M19012 Primary osteoarthritis, left shoulder: Secondary | ICD-10-CM | POA: Diagnosis not present

## 2022-05-12 DIAGNOSIS — M25512 Pain in left shoulder: Secondary | ICD-10-CM

## 2022-05-12 DIAGNOSIS — R202 Paresthesia of skin: Secondary | ICD-10-CM | POA: Diagnosis not present

## 2022-05-12 MED ORDER — METHOCARBAMOL 500 MG PO TABS: 500.0000 mg | ORAL_TABLET | Freq: Three times a day (TID) | ORAL | 0 refills | Status: DC | PRN

## 2022-05-12 MED ORDER — NAPROXEN 500 MG PO TABS: 500.0000 mg | ORAL_TABLET | Freq: Two times a day (BID) | ORAL | 0 refills | Status: DC

## 2022-05-12 NOTE — Assessment & Plan Note (Signed)
Associated with left shoulder pain x 2yr, worsening in last 2years. Denies any previous injury. No OTC med used.  Start NSAID and muscle relexant. Get shoulder x-ray today Consider outpt PT if normal x-ray. F/up in [employer]

## 2022-05-12 NOTE — Patient Instructions (Addendum)
Go to [address] for x-ray Start naproxen and robaxin as prescribed Will order outpatient PT if normal x-ray

## 2022-05-12 NOTE — Progress Notes (Signed)
Acute Office Visit  Subjective:    Patient ID: Jonathon Harris, male    DOB: 06-Apr-1976, 47 y.o.   MRN: [id number]  Chief Complaint  Patient presents with   Acute Visit    C/o left shoulder pain x 10 years has been more severe past 2 years  Numbness at times    HPI Radiculopathy Associated with left shoulder pain x 62yr, worsening in last 2years. Denies any previous injury. No OTC med used.  Start NSAID and muscle relexant. Get shoulder x-ray today Consider outpt PT if normal x-ray. F/up in 6-8weeks   Outpatient Medications Prior to Visit  Medication Sig   B Complex-C-Folic Acid (HM SUPER VITAMIN B COMPLEX/C) TABS daily.   Cholecalciferol (VITAMIN D) 2000 units tablet Take 1 tablet (2,000 Units total) by mouth daily.   diphenhydrAMINE HCl, Sleep, 25 MG CAPS daily as needed.   Omega-3 1000 MG CAPS Take 1 capsule (1,000 mg total) by mouth 2 (two) times daily after a meal.   [DISCONTINUED] IBUPROFEN PO Take by mouth. PRN   [DISCONTINUED] NON FORMULARY Manuka Honey with Zinc and Turmeric daily   [DISCONTINUED] TURMERIC PO Take by mouth. 500 mg three times weekly   No facility-administered medications prior to visit.    Reviewed past medical and social history.  Review of Systems Per HPI     Objective:    Physical Exam Vitals reviewed.  Cardiovascular:     Rate and Rhythm: Normal rate.     Pulses: Normal pulses.  Pulmonary:     Effort: Pulmonary effort is normal.  Musculoskeletal:        General: Tenderness present.     Right shoulder: Normal.     Left shoulder: Tenderness present. No effusion, bony tenderness or crepitus. Normal range of motion. Normal strength. Normal pulse.     Left upper arm: Normal.     Left elbow: Normal.     Left forearm: Normal.     Left wrist: Normal.     Left hand: Normal.     Cervical back: Normal, normal range of motion and neck supple. No rigidity or tenderness.     Thoracic back: Normal.     Comments: Posterior shoulder  tenderness with palpable muscle spasm. No muscle atrophy noted  Lymphadenopathy:     Cervical: No cervical adenopathy.  Neurological:     Mental Status: He is alert.     BP 112/72 (BP Location: Right Arm, Patient Position: Sitting, Cuff Size: Small)   Pulse 73   Temp 98.3 F (36.8 C) (Temporal)   Ht '5\' 8"'$  (1.727 m)   Wt 176 lb (79.8 kg)   SpO2 97%   BMI 26.76 kg/m    No results found for any visits on 05/12/22.      Assessment & Plan:   Problem List Items Addressed This Visit       Nervous and Auditory   Radiculopathy    Associated with left shoulder pain x 166yr worsening in last 2years. Denies any previous injury. No OTC med used.  Start NSAID and muscle relexant. Get shoulder x-ray today Consider outpt PT if normal x-ray. F/up in 6-8weeks      Relevant Medications   methocarbamol (ROBAXIN) 500 MG tablet   Other Visit Diagnoses     Chronic left shoulder pain    -  Primary   Relevant Medications   naproxen (NAPROSYN) 500 MG tablet   methocarbamol (ROBAXIN) 500 MG tablet   Other Relevant Orders   DG  Shoulder Left        Meds ordered this encounter  Medications   naproxen (NAPROSYN) 500 MG tablet    Sig: Take 1 tablet (500 mg total) by mouth 2 (two) times daily with a meal.    Dispense:  28 tablet    Refill:  0    Order Specific Question:   Supervising Provider    Answer:   Abelino Derrick ALFRED [[id number]]   methocarbamol (ROBAXIN) 500 MG tablet    Sig: Take 1 tablet (500 mg total) by mouth every 8 (eight) hours as needed for muscle spasms.    Dispense:  21 tablet    Refill:  0    Order Specific Question:   Supervising Provider    Answer:   Libby Maw [[id number]]   Return if symptoms worsen or fail to improve.    Wilfred Lacy, NP

## 2022-05-15 NOTE — Addendum Note (Signed)
Addended by: Wilfred Lacy L on: 05/15/2022 01:11 PM   Modules accepted: Orders

## 2022-05-28 DIAGNOSIS — Z419 Encounter for procedure for purposes other than remedying health state, unspecified: Secondary | ICD-10-CM | POA: Diagnosis not present

## 2022-05-29 NOTE — Therapy (Signed)
OUTPATIENT PHYSICAL THERAPY SHOULDER EVALUATION   Patient Name: Jonathon Harris MRN: [id number] DOB:April 01, 1976, 47 y.o., male Today's Date: 06/01/2022  END OF SESSION:  PT End of Session - 06/01/22 1015     Visit Number 1    Number of Visits 17    Date for PT Re-Evaluation 07/27/22    Authorization Type [employer]    Authorization Time Period auth TBD    PT Start Time 1016    PT Stop Time 1101    PT Time Calculation (min) 45 min    Activity Tolerance Patient tolerated treatment well    Behavior During Therapy [facility] for tasks assessed/performed             Past Medical History:  Diagnosis Date   Elevated BP without diagnosis of hypertension 05/14/2016   Thyroid disease    hyperactive- no medications at this time   Past Surgical History:  Procedure Laterality Date   CYST EXCISION     chest   Patient Active Problem List   Diagnosis Date Noted   Pseudopolyposis of colon without complication, unspecified part of colon (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  THERAPY DIAG:  Chronic left shoulder pain  Stiffness of left shoulder, not elsewhere classified  Muscle weakness (generalized)  Rationale for Evaluation and Treatment: Rehabilitation  ONSET DATE: 20-30 years ago  SUBJECTIVE:                                                                                                                                                                                      SUBJECTIVE STATEMENT: Pt reports dull pain in L shoulder over past 20 years,  notes that he played football in middle school and high school, and this shoulder is the one that tended to collide with people. States it is more prominent over the past 2-3 years, has started bothering him while sleeping (wakes up at least 2-3 times a night).  Reports he has had a couple instances of weakness in arm but mostly limited by pain. Has started going to the gym more often lately (once a week on average), and notices L arm is weaker than R (difficulty w/ chest press, triceps pulldown, lat pulldowns). Pt states he is able  to compensate with ADLs because of having pain for so long.  Denies numbness (aside from occasionally while lying on it sleeping), no tingling, no fevers/night sweats, no weight changes.  No previous treatment aside from chiropractor a few years ago, transient relief.    PERTINENT HISTORY: Thyroid, hx tobacco  PAIN:  Are you having pain: yes, 4/10 Location/description: L shoulder, dull aching, pulling, deep Best-worst over past week: 2-8/10   Per eval -  - aggravating factors: sleeping on L, reaching overhead or to side, lifting heavy items, sitting for prolonged periods - Easing factors: movement, stretching, medication  PRECAUTIONS: None  WEIGHT BEARING RESTRICTIONS: No  FALLS:  Has patient fallen in last 6 months? No  LIVING ENVIRONMENT: Grandchildren ages 5-14, with wife   2 story  OCCUPATION: Not working currently   PLOF: Independent  PATIENT GOALS: be able to sleep without tossing and turning all night  NEXT MD VISIT: none scheduled at present   OBJECTIVE:   DIAGNOSTIC FINDINGS:  L GH XR unremarkable for acute abnormalities per chart review  PATIENT SURVEYS:  FOTO: 55% current, 67% predicted   COGNITION: Overall cognitive status: Within functional limits for tasks assessed     SENSATION: WFL B UE  POSTURE: Grossly WNL, mild FHP  UPPER EXTREMITY ROM:  A/PROM Right eval Left eval  Shoulder flexion 178 deg 150 deg *  Shoulder  abduction 175 deg 158 deg *  Shoulder internal rotation    Shoulder external rotation    Elbow flexion    Elbow extension    Wrist flexion    Wrist extension     (Blank rows = not tested) (Key: WFL = within functional limits not formally assessed, * = concordant pain, s = stiffness/stretching sensation, NT = not tested)  Comments:   UPPER EXTREMITY MMT:  MMT Right eval Left eval  Shoulder flexion 5 4 *  Shoulder extension    Shoulder abduction 5 4 *   Shoulder extension    Shoulder internal rotation 5 4 (elbow)  Shoulder external rotation 5 4 - *  Elbow flexion  5  Elbow extension  4+ (posterior shoulder pain)  Grip strength    (Blank rows = not tested)  (Key: WFL = within functional limits not formally assessed, * = concordant pain, s = stiffness/stretching sensation, NT = not tested)  Comments:    PALPATION:  Concordant tenderness L infraspinatus/teres, mild tenderness L LS, lat, deltoid, bicipital groove   TODAY'S TREATMENT:                                                                                                                                         [facility] Adult PT Treatment:  DATE: 06/01/22 Therapeutic Exercise: Shoulder iso ER x5 cues for HEP and setup HEP handout/education   PATIENT EDUCATION: Education details: Pt education on PT impairments, prognosis, and POC. Informed consent. Rationale for interventions, safe/appropriate HEP performance Person educated: Patient Education method: Explanation, Demonstration, Tactile cues, Verbal cues, and Handouts Education comprehension: verbalized understanding, returned demonstration, verbal cues required, tactile cues required, and needs further education    HOME EXERCISE PROGRAM: Access Code: [email or web address] URL: [email or web address] Date: 06/01/2022 Prepared by: Enis Slipper  Exercises - Standing Isometric Shoulder External Rotation with Doorway  - 1 x  daily - 7 x weekly - 3 sets - 5 reps  ASSESSMENT:  CLINICAL IMPRESSION: Patient is a pleasant 47 y.o. gentleman who was seen today for physical therapy evaluation and treatment for chronic L shoulder pain. Pt reports most difficulty w/ sleeping and overhead reaching. On exam pt demos GH weakness on L side, reduced GH mobility, concordant pain w/ palpation of RC musculature. Initial discomfort with HEP that improves with repetition, handout provided. No adverse events, reports some soreness on departure. Anticipate pt would benefit from skilled PT to address aforementioned deficits in order to facilitate improved activity tolerance/independence. Pt departs today's session in no acute distress, all voiced questions/concerns addressed appropriately from PT perspective.    OBJECTIVE IMPAIRMENTS: decreased activity tolerance, decreased endurance, decreased mobility, decreased ROM, decreased strength, hypomobility, impaired UE functional use, and pain.   ACTIVITY LIMITATIONS: carrying, sitting, sleeping, and reach over head  PARTICIPATION LIMITATIONS: meal prep, cleaning, and community activity  PERSONAL FACTORS: Time since onset of injury/illness/exacerbation are also affecting patient's functional outcome.   REHAB POTENTIAL: Good  CLINICAL DECISION MAKING: Stable/uncomplicated  EVALUATION COMPLEXITY: Low   GOALS: Goals reviewed with patient? No  SHORT TERM GOALS: Target date: 06/29/2022   Pt will demonstrate appropriate understanding and performance of initially prescribed HEP in order to facilitate improved independence with management of symptoms.  Baseline: HEP provided on eval Goal status: INITIAL   2. Pt will score greater than or equal to 61% on FOTO in order to demonstrate improved perception of function due to symptoms.  Baseline: 55%  Goal status: INITIAL    LONG TERM GOALS: Target date: 07/27/2022 Pt will score 67% on FOTO in order to demonstrate improved perception of function  due to symptoms. Baseline: 55% Goal status: INITIAL  2.  Pt will demonstrate at least 165 degrees of active L shoulder elevation in order to demonstrate improved tolerance to functional movement patterns such as reaching overhead.  Baseline: see ROM chart above Goal status: INITIAL  3.  Pt will demonstrate at least 4+/5 shoulder ER/IR MMT for improved symmetry of UE strength and improved tolerance to functional movements.  Baseline: see MMT chart above Goal status: INITIAL  4. Pt will report/demonstrate ability to perform overhead reach with less than 2 point increase in pain on NPS in order to indicative improved tolerance/independence with functional tasks such as cleaning/dressing.  Baseline: up to 8/10 with daily activities  Goal status: INITIAL    5. Pt will report ability to sleep through the night with less than 2 interruptions due to L shoulder pain in order to facilitate improved overall health and QOL.    Baseline: waking 2-3 times a night on average   Goal status: INITIAL   PLAN:  PT FREQUENCY: 2x/week  PT DURATION: 8 weeks  PLANNED INTERVENTIONS: Therapeutic exercises, Therapeutic activity, Neuromuscular re-education, Balance training, Gait training, Patient/Family education, Self Care, Joint mobilization, Joint manipulation, Dry Needling, Electrical stimulation, Spinal manipulation,  Spinal mobilization, Cryotherapy, Moist heat, Taping, Manual therapy, and Re-evaluation  PLAN FOR NEXT SESSION: review/update HEP. Manual as indicated, periscapular/GH strength/mobility as able/appropriate   Leeroy Cha PT, DPT 06/01/2022 12:48 PM    [employer] Authorization   Choose one: Rehabilitative  Standardized Assessment or Functional Outcome Tool: Other FOTO  Score or Percent Disability: 55%  Body Parts Treated (Select each separately):  Shoulder. Overall deficits/functional limitations for body part selected: moderate    If treatment provided at initial evaluation, no  treatment charged due to lack of authorization.

## 2022-06-01 ENCOUNTER — Encounter: Payer: Self-pay | Admitting: Physical Therapy

## 2022-06-01 ENCOUNTER — Other Ambulatory Visit: Payer: Self-pay

## 2022-06-01 ENCOUNTER — Ambulatory Visit: Payer: Medicaid Other | Attending: Nurse Practitioner | Admitting: Physical Therapy

## 2022-06-01 DIAGNOSIS — M25512 Pain in left shoulder: Secondary | ICD-10-CM | POA: Diagnosis not present

## 2022-06-01 DIAGNOSIS — M19019 Primary osteoarthritis, unspecified shoulder: Secondary | ICD-10-CM | POA: Insufficient documentation

## 2022-06-01 DIAGNOSIS — M25612 Stiffness of left shoulder, not elsewhere classified: Secondary | ICD-10-CM | POA: Diagnosis not present

## 2022-06-01 DIAGNOSIS — M6281 Muscle weakness (generalized): Secondary | ICD-10-CM | POA: Diagnosis not present

## 2022-06-01 DIAGNOSIS — G8929 Other chronic pain: Secondary | ICD-10-CM | POA: Insufficient documentation

## 2022-06-08 ENCOUNTER — Encounter: Payer: Self-pay | Admitting: Physical Therapy

## 2022-06-08 ENCOUNTER — Ambulatory Visit: Payer: Medicaid Other | Admitting: Physical Therapy

## 2022-06-08 DIAGNOSIS — M6281 Muscle weakness (generalized): Secondary | ICD-10-CM

## 2022-06-08 DIAGNOSIS — M25612 Stiffness of left shoulder, not elsewhere classified: Secondary | ICD-10-CM

## 2022-06-08 DIAGNOSIS — M19019 Primary osteoarthritis, unspecified shoulder: Secondary | ICD-10-CM | POA: Diagnosis not present

## 2022-06-08 DIAGNOSIS — G8929 Other chronic pain: Secondary | ICD-10-CM

## 2022-06-08 DIAGNOSIS — M25512 Pain in left shoulder: Secondary | ICD-10-CM | POA: Diagnosis not present

## 2022-06-08 NOTE — Therapy (Addendum)
OUTPATIENT PHYSICAL THERAPY TREATMENT NOTE   Patient Name: Jonathon Harris MRN: [id number] DOB:12/27/1975, 47 y.o., male Today's Date: 06/08/2022  PCP: Flossie Buffy, NP   REFERRING PROVIDER: Flossie Buffy, NP  END OF SESSION:   PT End of Session - 06/08/22 1016     Visit Number 2    Number of Visits 17    Date for PT Re-Evaluation 07/27/22    Authorization Type [employer]    Authorization Time Period 06/08/22-08/07/22    Authorization - Visit Number 1    Authorization - Number of Visits 10    PT Start Time [id number]    PT Stop Time 1053    PT Time Calculation (min) 38 min             Past Medical History:  Diagnosis Date   Elevated BP without diagnosis of hypertension 05/14/2016   Thyroid disease    hyperactive- no medications at this time   Past Surgical History:  Procedure Laterality Date   CYST EXCISION     chest   Patient Active Problem List   Diagnosis Date Noted   Pseudopolyposis of colon without complication, unspecified part of colon ([place]) 05/08/2022   Radiculopathy 02/07/2021   Benign colon polyp 08/02/2020   Low TSH level 03/15/2020   Prediabetes 02/09/2020   Family hx of colon cancer 02/01/2020   History of tobacco use 02/01/2020   Hyperlipidemia 05/12/2016   Chronic bilateral back pain 02/10/2016    REFERRING DIAG: M25.512,G89.Jonathon (ICD-10-CM) - Chronic left shoulder pain 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 Shoulder is a little  less painful.    PAIN:  Are you having pain: yes, 4/10 Location/description: L shoulder, dull aching, pulling, deep Best-worst over past week: 2-8/10   Per eval -  - aggravating factors: sleeping on L, reaching overhead or to side, lifting heavy items, sitting for prolonged periods - Easing factors: movement, stretching, medication   OBJECTIVE: (objective measures completed at initial evaluation unless otherwise dated)   DIAGNOSTIC FINDINGS:  L GH XR unremarkable for acute abnormalities per chart review   PATIENT SURVEYS:  FOTO: 55% current, 67% predicted    COGNITION:  Overall cognitive status: Within functional limits for tasks assessed                                  SENSATION: WFL B UE   POSTURE: Grossly WNL, mild FHP   UPPER EXTREMITY ROM:   A/PROM Right eval Left eval  Shoulder flexion 178 deg 150 deg *  Shoulder abduction 175 deg 158 deg *  Shoulder internal rotation      Shoulder external rotation      Elbow flexion      Elbow extension      Wrist flexion      Wrist extension       (Blank rows = not tested) (Key: WFL = within functional limits not formally assessed, * = concordant pain, s = stiffness/stretching sensation, NT = not tested)  Comments:    UPPER EXTREMITY MMT:   MMT Right eval Left eval  Shoulder flexion 5 4 *  Shoulder extension      Shoulder abduction 5 4 *   Shoulder extension      Shoulder internal rotation 5 4 (elbow)  Shoulder external rotation 5 4 - *  Elbow flexion   5  Elbow extension   4+ (posterior shoulder pain)  Grip strength      (Blank rows = not tested)  (Key: WFL = within functional limits not formally assessed, * = concordant pain, s = stiffness/stretching sensation, NT = not tested)  Comments:      PALPATION:  Concordant tenderness L infraspinatus/teres, mild tenderness L LS, lat, deltoid, bicipital groove             TODAY'S TREATMENT:                 [facility] Adult PT Treatment:                                                 DATE: 06/08/22 Therapeutic Exercise: UBE L2 2 min each way Isometric shoulder ER 5 sec x 15 Pulleys  Supine OH pullovers with dowel.  Supine horizontal ab and add with dowel  Supine red band horizontal abduction Supine red band ER  Supine alternating diagonals red band  Manual Therapy: Passive ROM flex, abduction, IR, ER  STW left posterior shoulder                                                                                                                              [facility] Adult PT Treatment:  DATE: 06/01/22 Therapeutic Exercise: Shoulder iso ER x5 cues for HEP and setup HEP handout/education     PATIENT EDUCATION: Education details: Pt education on PT impairments, prognosis, and POC. Informed consent. Rationale for interventions, safe/appropriate HEP performance Person educated: Patient Education method: Explanation, Demonstration, Tactile cues, Verbal cues, and Handouts Education comprehension: verbalized understanding, returned demonstration, verbal cues required, tactile cues required, and needs further education     HOME EXERCISE PROGRAM: Access Code: [email or web address] URL: [email or web address] Date: 06/01/2022 Prepared by: Enis Slipper   Exercises - Standing Isometric Shoulder External Rotation with Doorway  - 1 x daily - 7 x weekly - 3 sets - 5 reps - Supine Shoulder Horizontal Abduction with Resistance  - 1 x daily - 7 x weekly - 2 sets - 10 reps - Supine shoulder external rotation- KNEES BENT  - 1 x daily - 7 x weekly - 2 sets - 10 reps - SUPINE Alternating star pattern  - 1 x daily - 7 x weekly - 2 sets - 10 reps    ASSESSMENT:   CLINICAL IMPRESSION: Patient is a pleasant Jonathon Harris who was seen today for physical therapy treatment for chronic L shoulder pain. Pt reports some improvement with initial HEP. Progressed with ROM and strengthening with light resistance. He tolerated  session well with min increase in pain. Trigger point release to performed to posterior shoulder. Pt given tennis ball to perform STW at home.    OBJECTIVE IMPAIRMENTS: decreased activity tolerance, decreased endurance, decreased mobility, decreased ROM, decreased strength, hypomobility, impaired UE functional use, and pain.    ACTIVITY LIMITATIONS: carrying, sitting, sleeping, and reach over head   PARTICIPATION LIMITATIONS: meal prep, cleaning, and community activity   PERSONAL FACTORS: Time since onset of injury/illness/exacerbation are also affecting patient's functional outcome.    REHAB POTENTIAL: Good   CLINICAL DECISION MAKING: Stable/uncomplicated   EVALUATION COMPLEXITY: Low     GOALS: Goals reviewed with patient? No   SHORT TERM GOALS: Target date: 06/29/2022   Pt will demonstrate appropriate understanding and performance of initially prescribed HEP in order to facilitate improved independence with management of symptoms.  Baseline: HEP provided on eval Goal status: INITIAL    2. Pt will score greater than or equal to 61% on FOTO in order to demonstrate improved perception of function due to symptoms.            Baseline: 55%            Goal status: INITIAL     LONG TERM GOALS: Target date: 07/27/2022 Pt will score 67% on FOTO in order to demonstrate improved perception of function due to symptoms. Baseline: 55% Goal status: INITIAL   2.  Pt will demonstrate at least 165 degrees of active L shoulder elevation in order to demonstrate improved tolerance to functional movement patterns such as reaching overhead.  Baseline: see ROM chart above Goal status: INITIAL   3.  Pt will demonstrate at least 4+/5 shoulder ER/IR MMT for improved symmetry of UE strength and improved tolerance to functional movements.  Baseline: see MMT chart above Goal status: INITIAL   4. Pt will report/demonstrate ability to perform overhead reach with less than 2 point increase in pain on NPS in  order to indicative improved tolerance/independence with functional tasks such as cleaning/dressing.            Baseline: up to 8/10 with daily activities            Goal status: INITIAL  5. Pt will report ability to sleep through the night with less than 2 interruptions due to L shoulder pain in order to facilitate improved overall health and QOL.                       Baseline: waking 2-3 times a night on average                      Goal status: INITIAL    PLAN:   PT FREQUENCY: 2x/week   PT DURATION: 8 weeks   PLANNED INTERVENTIONS: Therapeutic exercises, Therapeutic activity, Neuromuscular re-education, Balance training, Gait training, Patient/Family education, Self Care, Joint mobilization, Joint manipulation, Dry Needling, Electrical stimulation, Spinal manipulation, Spinal mobilization, Cryotherapy, Moist heat, Taping, Manual therapy, and Re-evaluation   PLAN FOR NEXT SESSION: review/update HEP. Manual as indicated, periscapular/GH strength/mobility as able/appropriate    Hessie Diener, PTA 06/08/22 11:03 AM Phone: [phone] Fax: [phone]

## 2022-06-09 ENCOUNTER — Ambulatory Visit: Payer: Medicaid Other | Admitting: Physical Therapy

## 2022-06-09 ENCOUNTER — Encounter: Payer: Self-pay | Admitting: Physical Therapy

## 2022-06-09 DIAGNOSIS — M25612 Stiffness of left shoulder, not elsewhere classified: Secondary | ICD-10-CM

## 2022-06-09 DIAGNOSIS — M25512 Pain in left shoulder: Secondary | ICD-10-CM | POA: Diagnosis not present

## 2022-06-09 DIAGNOSIS — M6281 Muscle weakness (generalized): Secondary | ICD-10-CM | POA: Diagnosis not present

## 2022-06-09 DIAGNOSIS — M19019 Primary osteoarthritis, unspecified shoulder: Secondary | ICD-10-CM | POA: Diagnosis not present

## 2022-06-09 DIAGNOSIS — G8929 Other chronic pain: Secondary | ICD-10-CM | POA: Diagnosis not present

## 2022-06-09 NOTE — Therapy (Signed)
OUTPATIENT PHYSICAL THERAPY TREATMENT NOTE   Patient Name: Jonathon Harris MRN: [id number] DOB:[birth year], 47 y.o., male Today's Date: 06/09/2022  PCP: Flossie Buffy, NP   REFERRING PROVIDER: Flossie Buffy, NP  END OF SESSION:   PT End of Session - 06/09/22 1011     Visit Number 3    Number of Visits 17    Date for PT Re-Evaluation 07/27/22    Authorization Type [employer]    Authorization Time Period 06/08/22-08/07/22    Authorization - Visit Number 2    Authorization - Number of Visits 10    PT Start Time [id number]    PT Stop Time 1053    PT Time Calculation (min) 38 min             Past Medical History:  Diagnosis Date   Elevated BP without diagnosis of hypertension 05/14/2016   Thyroid disease    hyperactive- no medications at this time   Past Surgical History:  Procedure Laterality Date   CYST EXCISION     chest   Patient Active Problem List   Diagnosis Date Noted   Pseudopolyposis of colon without complication, unspecified part of colon ([place]) 05/08/2022   Radiculopathy 02/07/2021   Benign colon polyp 08/02/2020   Low TSH level 03/15/2020   Prediabetes 02/09/2020   Family hx of colon cancer 02/01/2020   History of tobacco use 02/01/2020   Hyperlipidemia 05/12/2016   Chronic bilateral back pain 02/10/2016    REFERRING DIAG: M25.512,G89.29 (ICD-10-CM) - Chronic left shoulder pain 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 Did not take anything.    PAIN:  Are you having pain: yes,  4/10 Location/description: L shoulder, dull aching, pulling, deep Best-worst over past week: 2-8/10   Per eval -  - aggravating factors: sleeping on L, reaching overhead or to side, lifting heavy items, sitting for prolonged periods - Easing factors: movement, stretching, medication   OBJECTIVE: (objective measures completed at initial evaluation unless otherwise dated)   DIAGNOSTIC FINDINGS:  L GH XR unremarkable for acute abnormalities per chart review   PATIENT SURVEYS:  FOTO: 55% current, 67% predicted    COGNITION: Overall cognitive status: Within functional limits  for tasks assessed                                  SENSATION: WFL B UE   POSTURE: Grossly WNL, mild FHP   UPPER EXTREMITY ROM:   A/PROM Right eval Left eval  Shoulder flexion 178 deg 150 deg *  Shoulder abduction 175 deg 158 deg *  Shoulder internal rotation      Shoulder external rotation      Elbow flexion      Elbow extension      Wrist flexion      Wrist extension       (Blank rows = not tested) (Key: WFL = within functional limits not formally assessed, * = concordant pain, s = stiffness/stretching sensation, NT = not tested)  Comments:    UPPER EXTREMITY MMT:   MMT Right eval Left eval  Shoulder flexion 5 4 *  Shoulder extension      Shoulder abduction 5 4 *   Shoulder extension      Shoulder internal rotation 5 4 (elbow)  Shoulder external rotation 5 4 - *  Elbow flexion   5  Elbow extension   4+ (posterior shoulder pain)  Grip strength      (Blank rows = not tested)  (Key: WFL = within functional limits not formally assessed, * = concordant pain, s = stiffness/stretching sensation, NT = not tested)  Comments:      PALPATION:  Concordant tenderness L infraspinatus/teres, mild tenderness L LS, lat, deltoid, bicipital groove             TODAY'S TREATMENT:                [facility] Adult PT Treatment:                                                DATE: 06/09/22 Therapeutic  Exercise: UBE L2 x 2 minutes each way  Pulleys Standing red band IR and ER 10 x 2 each Supine OH pullovers 2# 10 x 2  Supine horizontal ab and add with dowel  Supine green  band horizontal abduction 10 x 2  Supine green  band ER 10 x 2  Supine alternating diagonals green  band  10 x 2  Manual Therapy: Passive ROM flex, abduction, IR, ER  STW left posterior shoulder    [facility] Adult PT Treatment:                                                DATE: 06/08/22 Therapeutic Exercise: UBE L2 2 min each way Isometric shoulder ER 5 sec x 15 Pulleys  Supine OH pullovers with dowel.  Supine horizontal ab and add with dowel  Supine red band horizontal abduction Supine red band ER  Supine alternating diagonals red band  Manual Therapy: Passive ROM flex, abduction, IR, ER  STW left posterior shoulder  [facility] Adult PT Treatment:                                                DATE: 06/01/22 Therapeutic Exercise: Shoulder iso ER x5 cues for HEP and setup HEP handout/education     PATIENT EDUCATION: Education details: Pt education on PT impairments, prognosis, and POC. Informed consent. Rationale for interventions, safe/appropriate HEP performance Person educated: Patient Education method: Explanation, Demonstration, Tactile cues, Verbal cues, and Handouts Education comprehension: verbalized understanding, returned demonstration, verbal cues required, tactile cues required, and needs further education     HOME EXERCISE PROGRAM: Access Code: [email or web address] URL: [email or web address] Date: 06/01/2022 Prepared by: Enis Slipper   Exercises - Standing Isometric Shoulder External Rotation with Doorway  - 1 x daily - 7 x weekly - 3 sets - 5 reps - Supine Shoulder Horizontal Abduction with Resistance  - 1 x daily - 7 x weekly - 2 sets - 10 reps - Supine shoulder  external rotation- KNEES BENT  - 1 x daily - 7 x weekly - 2 sets - 10 reps - SUPINE Alternating star pattern  - 1 x daily - 7 x weekly - 2 sets - 10 reps    ASSESSMENT:   CLINICAL IMPRESSION: Patient is a pleasant 47 y.o. gentleman who was seen today for physical therapy treatment for chronic L shoulder pain. Pt reports min soreness after yesterday's treatment and did not need his pain meds/ muscle relaxants. Progressed with ROM and strengthening with increased resistance. Issued stronger band for HEP.  He tolerated session well without c/o increased  pain. Trigger point release performed to posterior shoulder, medial and lateral borders of scapula.    OBJECTIVE IMPAIRMENTS: decreased activity tolerance, decreased endurance, decreased mobility, decreased ROM, decreased strength, hypomobility, impaired UE functional use, and pain.    ACTIVITY LIMITATIONS: carrying, sitting, sleeping, and reach over head   PARTICIPATION LIMITATIONS: meal prep, cleaning, and community activity   PERSONAL FACTORS: Time since onset of injury/illness/exacerbation are also affecting patient's functional outcome.    REHAB POTENTIAL: Good   CLINICAL DECISION MAKING: Stable/uncomplicated   EVALUATION COMPLEXITY: Low     GOALS: Goals reviewed with patient? No   SHORT TERM GOALS: Target date: 06/29/2022   Pt will demonstrate appropriate understanding and performance of initially prescribed HEP in order to facilitate improved independence with management of symptoms.  Baseline: HEP provided on eval Goal status: INITIAL    2. Pt will score greater than or equal to 61% on FOTO in order to demonstrate improved perception of function due to symptoms.            Baseline: 55%            Goal status: INITIAL     LONG TERM GOALS: Target date: 07/27/2022 Pt will score 67% on FOTO in order to demonstrate improved perception of function due to symptoms. Baseline: 55% Goal status: INITIAL   2.  Pt will demonstrate at  least 165 degrees of active L shoulder elevation in order to demonstrate improved tolerance to functional movement patterns such as reaching overhead.  Baseline: see ROM chart above Goal status: INITIAL   3.  Pt will demonstrate at least 4+/5 shoulder ER/IR MMT for improved symmetry of UE strength and improved tolerance to functional movements.  Baseline: see MMT chart above Goal status: INITIAL   4. Pt will report/demonstrate  ability to perform overhead reach with less than 2 point increase in pain on NPS in order to indicative improved tolerance/independence with functional tasks such as cleaning/dressing.            Baseline: up to 8/10 with daily activities            Goal status: INITIAL              5. Pt will report ability to sleep through the night with less than 2 interruptions due to L shoulder pain in order to facilitate improved overall health and QOL.                       Baseline: waking 2-3 times a night on average                      Goal status: INITIAL    PLAN:   PT FREQUENCY: 2x/week   PT DURATION: 8 weeks   PLANNED INTERVENTIONS: Therapeutic exercises, Therapeutic activity, Neuromuscular re-education, Balance training, Gait training, Patient/Family education, Self Care, Joint mobilization, Joint manipulation, Dry Needling, Electrical stimulation, Spinal manipulation, Spinal mobilization, Cryotherapy, Moist heat, Taping, Manual therapy, and Re-evaluation   PLAN FOR NEXT SESSION: review/update HEP. Manual as indicated, periscapular/GH strength/mobility as able/appropriate    Hessie Diener, PTA 06/09/22 12:47 PM Phone: [phone] Fax: [phone]

## 2022-06-16 ENCOUNTER — Ambulatory Visit: Payer: Medicaid Other | Admitting: Physical Therapy

## 2022-06-18 NOTE — Therapy (Incomplete)
OUTPATIENT PHYSICAL THERAPY TREATMENT NOTE   Patient Name: Jonathon Harris MRN: [id number] DOB:1975-09-11, 47 y.o., male Today's Date: 06/18/2022  PCP: Flossie Buffy, NP   REFERRING PROVIDER: Flossie Buffy, NP  END OF SESSION:     Past Medical History:  Diagnosis Date   Elevated BP without diagnosis of hypertension 05/14/2016   Thyroid disease    hyperactive- no medications at this time   Past Surgical History:  Procedure Laterality Date   CYST EXCISION     chest   Patient Active Problem List   Diagnosis Date Noted   Pseudopolyposis of colon without complication, unspecified part of colon ([place]) 05/08/2022   Radiculopathy 02/07/2021   Benign colon polyp 08/02/2020   Low TSH level 03/15/2020   Prediabetes 02/09/2020   Family hx of colon cancer 02/01/2020   History of tobacco use 02/01/2020   Hyperlipidemia 05/12/2016   Chronic bilateral back pain 02/10/2016    REFERRING DIAG: M25.512,G89.29 (ICD-10-CM) - Chronic left shoulder pain 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  Rationale for Evaluation and Treatment Rehabilitation  PERTINENT HISTORY: Thyroid, hx tobacco  PRECAUTIONS: none  SUBJECTIVE:                                                                                                                                                                                      SUBJECTIVE STATEMENT:  I was a little sore, not bad. Did not take anything. ***  ***   PAIN:  Are you having pain: yes, *** 4/10 Location/description: L shoulder, dull aching, pulling, deep Best-worst over past week: 2-8/10   Per eval -  - aggravating factors: sleeping on L, reaching overhead or to side, lifting heavy items, sitting for prolonged periods - Easing factors: movement, stretching, medication   OBJECTIVE: (objective measures completed at initial evaluation unless otherwise dated)   DIAGNOSTIC FINDINGS:  L GH XR  unremarkable for acute abnormalities per chart review   PATIENT SURVEYS:  FOTO: 55% current, 67% predicted    COGNITION: Overall cognitive status: Within functional limits for tasks assessed                                  SENSATION: WFL B UE   POSTURE: Grossly WNL, mild FHP   UPPER EXTREMITY ROM:   A/PROM Right eval Left eval  Shoulder flexion 178 deg 150 deg *  Shoulder abduction 175 deg 158 deg *  Shoulder internal rotation      Shoulder external rotation      Elbow flexion  Elbow extension      Wrist flexion      Wrist extension       (Blank rows = not tested) (Key: WFL = within functional limits not formally assessed, * = concordant pain, s = stiffness/stretching sensation, NT = not tested)  Comments:    UPPER EXTREMITY MMT:   MMT Right eval Left eval  Shoulder flexion 5 4 *  Shoulder extension      Shoulder abduction 5 4 *   Shoulder extension      Shoulder internal rotation 5 4 (elbow)  Shoulder external rotation 5 4 - *  Elbow flexion   5  Elbow extension   4+ (posterior shoulder pain)  Grip strength      (Blank rows = not tested)  (Key: WFL = within functional limits not formally assessed, * = concordant pain, s = stiffness/stretching sensation, NT = not tested)  Comments:      PALPATION:  Concordant tenderness L infraspinatus/teres, mild tenderness L LS, lat, deltoid, bicipital groove             TODAY'S TREATMENT:                [facility] Adult PT Treatment:                                                DATE: 06/19/22 Therapeutic Exercise: *** Manual Therapy: *** Neuromuscular re-ed: *** Therapeutic Activity: *** Modalities: *** Self Care: ***   [facility] Adult PT Treatment:                                                DATE: 06/09/22 Therapeutic Exercise: UBE L2 x 2 minutes each way  Pulleys Standing red band IR and ER 10 x 2 each Supine OH pullovers 2# 10 x 2  Supine horizontal ab and add with dowel  Supine green  band horizontal  abduction 10 x 2  Supine green  band ER 10 x 2  Supine alternating diagonals green  band  10 x 2  Manual Therapy: Passive ROM flex, abduction, IR, ER  STW left posterior shoulder    [facility] Adult PT Treatment:                                                DATE: 06/08/22 Therapeutic Exercise: UBE L2 2 min each way Isometric shoulder ER 5 sec x 15 Pulleys  Supine OH pullovers with dowel.  Supine horizontal ab and add with dowel  Supine red band horizontal abduction Supine red band ER  Supine alternating diagonals red band  Manual Therapy: Passive ROM flex, abduction, IR, ER  STW left posterior shoulder  PATIENT EDUCATION: Education details: Pt education on PT impairments, prognosis, and POC. Informed consent. Rationale for interventions, safe/appropriate HEP performance Person educated: Patient Education method: Explanation, Demonstration, Tactile cues, Verbal cues, and Handouts Education comprehension: verbalized understanding, returned demonstration, verbal cues required, tactile cues required, and needs further education     HOME EXERCISE PROGRAM: Access Code: [email or web address] URL: [email or web address] Date: 06/01/2022 Prepared by: Enis Slipper   Exercises - Standing Isometric Shoulder External Rotation with Doorway  - 1 x daily - 7 x weekly - 3 sets - 5 reps - Supine Shoulder Horizontal Abduction with Resistance  - 1 x daily - 7 x weekly - 2 sets - 10 reps - Supine shoulder external rotation- KNEES BENT  - 1 x daily - 7 x weekly - 2 sets - 10 reps - SUPINE Alternating star pattern  - 1 x daily - 7 x weekly - 2 sets - 10 reps    ASSESSMENT:   CLINICAL IMPRESSION: ***  *** Patient is a pleasant 47 y.o. gentleman who was seen today for physical therapy treatment for chronic L shoulder pain. Pt reports min soreness after yesterday's treatment and  did not need his pain meds/ muscle relaxants. Progressed with ROM and strengthening with increased resistance. Issued stronger band for HEP.  He tolerated session well without c/o increased  pain. Trigger point release performed to posterior shoulder, medial and lateral borders of scapula.    OBJECTIVE IMPAIRMENTS: decreased activity tolerance, decreased endurance, decreased mobility, decreased ROM, decreased strength, hypomobility, impaired UE functional use, and pain.    ACTIVITY LIMITATIONS: carrying, sitting, sleeping, and reach over head   PARTICIPATION LIMITATIONS: meal prep, cleaning, and community activity   PERSONAL FACTORS: Time since onset of injury/illness/exacerbation are also affecting patient's functional outcome.    REHAB POTENTIAL: Good   CLINICAL DECISION MAKING: Stable/uncomplicated   EVALUATION COMPLEXITY: Low     GOALS: Goals reviewed with patient? No   SHORT TERM GOALS: Target date: 06/29/2022   Pt will demonstrate appropriate understanding and performance of initially prescribed HEP in order to facilitate improved independence with management of symptoms.  Baseline: HEP provided on eval Goal status: INITIAL    2. Pt will score greater than or equal to 61% on FOTO in order to demonstrate improved perception of function due to symptoms.            Baseline: 55%            Goal status: INITIAL     LONG TERM GOALS: Target date: 07/27/2022 Pt will score 67% on FOTO in order to demonstrate improved perception of function due to symptoms. Baseline: 55% Goal status: INITIAL   2.  Pt will demonstrate at least 165 degrees of active L shoulder elevation in order to demonstrate improved tolerance to functional movement patterns such as reaching overhead.  Baseline: see ROM chart above Goal status: INITIAL   3.  Pt will demonstrate at least 4+/5 shoulder ER/IR MMT for improved symmetry of UE strength and improved tolerance to functional movements.  Baseline: see MMT  chart above Goal status: INITIAL   4. Pt will report/demonstrate ability to perform overhead reach with less than 2 point increase in pain on NPS in order to indicative improved tolerance/independence with functional tasks such as cleaning/dressing.            Baseline: up to 8/10 with daily activities            Goal status: INITIAL  5. Pt will report ability to sleep through the night with less than 2 interruptions due to L shoulder pain in order to facilitate improved overall health and QOL.                       Baseline: waking 2-3 times a night on average                      Goal status: INITIAL    PLAN:   PT FREQUENCY: 2x/week   PT DURATION: 8 weeks   PLANNED INTERVENTIONS: Therapeutic exercises, Therapeutic activity, Neuromuscular re-education, Balance training, Gait training, Patient/Family education, Self Care, Joint mobilization, Joint manipulation, Dry Needling, Electrical stimulation, Spinal manipulation, Spinal mobilization, Cryotherapy, Moist heat, Taping, Manual therapy, and Re-evaluation   PLAN FOR NEXT SESSION: review/update HEP. Manual as indicated, periscapular/GH strength/mobility as able/appropriate ***    Leeroy Cha PT, DPT 06/18/2022 8:51 AM

## 2022-06-19 ENCOUNTER — Ambulatory Visit: Payer: Medicaid Other | Admitting: Physical Therapy

## 2022-06-22 ENCOUNTER — Ambulatory Visit: Payer: Medicaid Other | Admitting: Physical Therapy

## 2022-06-22 ENCOUNTER — Encounter: Payer: Self-pay | Admitting: Physical Therapy

## 2022-06-22 DIAGNOSIS — M25612 Stiffness of left shoulder, not elsewhere classified: Secondary | ICD-10-CM

## 2022-06-22 DIAGNOSIS — M19019 Primary osteoarthritis, unspecified shoulder: Secondary | ICD-10-CM | POA: Diagnosis not present

## 2022-06-22 DIAGNOSIS — G8929 Other chronic pain: Secondary | ICD-10-CM | POA: Diagnosis not present

## 2022-06-22 DIAGNOSIS — M6281 Muscle weakness (generalized): Secondary | ICD-10-CM

## 2022-06-22 DIAGNOSIS — M25512 Pain in left shoulder: Secondary | ICD-10-CM | POA: Diagnosis not present

## 2022-06-22 NOTE — Therapy (Signed)
OUTPATIENT PHYSICAL THERAPY TREATMENT NOTE   Patient Name: Jonathon Harris MRN: [id number] DOB:1975/08/17, 47 y.o., male Today's Date: 06/22/2022  PCP: Flossie Buffy, NP   REFERRING PROVIDER: Flossie Buffy, NP  END OF SESSION:   PT End of Session - 06/22/22 1015     Visit Number 4    Number of Visits 17    Authorization Type [employer]    Authorization Time Period 06/08/22-08/07/22    Authorization - Visit Number 3    Authorization - Number of Visits 10    PT Start Time [id number]    PT Stop Time 1101    PT Time Calculation (min) 46 min    Activity Tolerance Patient tolerated treatment well    Behavior During Therapy WFL for tasks assessed/performed              Past Medical History:  Diagnosis Date   Elevated BP without diagnosis of hypertension 05/14/2016   Thyroid disease    hyperactive- no medications at this time   Past Surgical History:  Procedure Laterality Date   CYST EXCISION     chest   Patient Active Problem List   Diagnosis Date Noted   Pseudopolyposis of colon without complication, unspecified part of colon ([place]) 05/08/2022   Radiculopathy 02/07/2021   Benign colon polyp 08/02/2020   Low TSH level 03/15/2020   Prediabetes 02/09/2020   Family hx of colon cancer 02/01/2020   History of tobacco use 02/01/2020   Hyperlipidemia 05/12/2016   Chronic bilateral back pain 02/10/2016    REFERRING DIAG: M25.512,G89.29 (ICD-10-CM) - Chronic left shoulder pain 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 I can feel the progression easing and going away. It doesn't keep me up at as much at night."   PAIN:  Are you having pain: yes, 3/10 Location/description: L shoulder, dull aching, pulling, deep Best-worst over past week: 2-8/10   Per eval -  - aggravating factors: sleeping on L, reaching overhead or to side, lifting heavy items, sitting for prolonged periods - Easing factors: movement, stretching, medication   OBJECTIVE: (objective measures completed at initial evaluation unless otherwise dated)   DIAGNOSTIC FINDINGS:  L GH XR unremarkable for acute abnormalities per chart review   PATIENT SURVEYS:  FOTO: 55% current, 67% predicted    COGNITION: Overall cognitive status: Within functional limits for tasks assessed                                  SENSATION: WFL B UE   POSTURE: Grossly WNL, mild FHP   UPPER EXTREMITY ROM:   A/PROM Right eval Left eval  Shoulder flexion 178 deg 150 deg *  Shoulder abduction 175 deg 158 deg *  Shoulder internal rotation      Shoulder external rotation      Elbow flexion      Elbow extension      Wrist flexion      Wrist extension       (Blank rows = not tested) (Key: WFL = within functional limits not formally assessed, * = concordant pain, s = stiffness/stretching sensation, NT = not tested)  Comments:    UPPER EXTREMITY MMT:   MMT Right eval Left eval  Shoulder flexion 5 4 *  Shoulder extension      Shoulder abduction 5 4 *   Shoulder extension      Shoulder internal rotation 5 4 (elbow)  Shoulder external rotation 5 4 - *  Elbow flexion   5  Elbow extension   4+ (posterior shoulder pain)  Grip strength      (Blank rows = not tested)  (Key: WFL = within functional limits not formally assessed, * = concordant pain, s = stiffness/stretching sensation, NT = not tested)  Comments:      PALPATION:  Concordant tenderness L infraspinatus/teres, mild tenderness  L LS, lat, deltoid, bicipital groove             TODAY'S TREATMENT:                [facility] Adult PT Treatment:                                                DATE: 06/22/2022 Therapeutic Exercise: UBE L5 x 4 min (FWD/BWD x 2 min ea) LUE upper trap/ levator scapulae stretch 1 x 30 sec Rhomboid stretch standing with arms crossed leaning back from free motion 1 x 30 sec Standing sleeper stretch 2 x 30 sec LUE  Shoulder Scaption 2 x 12 with 2# LUE Scapular retraction 2 x 12 with GTB  Updated HEP for standing sleeper stretch, lower trap wall y's, and scaption strengthening.  Manual Therapy: IASTM along the L infraspinatus Scapular mobs in all planes except elevation grade IV  Trigger Point Dry-Needling  Treatment instructions: Expect mild to moderate muscle soreness. S/S of pneumothorax if dry needled over a lung field, and to seek immediate medical attention should they occur. Patient verbalized understanding of these instructions and education.  Patient Consent Given: Yes Education handout provided: Yes Muscles treated: infraspinatus on the L Electrical stimulation performed: Yes Parameters:  CPS 20 x 8 min adjusting to tolerance Treatment response/outcome: twtich response, reduction in pain.     [facility] Adult PT Treatment:  DATE: 06/09/22 Therapeutic Exercise: UBE L2 x 2 minutes each way  Pulleys Standing red band IR and ER 10 x 2 each Supine OH pullovers 2# 10 x 2  Supine horizontal ab and add with dowel  Supine green  band horizontal abduction 10 x 2  Supine green  band ER 10 x 2  Supine alternating diagonals green  band  10 x 2  Manual Therapy: Passive ROM flex, abduction, IR, ER  STW left posterior shoulder    [facility] Adult PT Treatment:                                                DATE: 06/08/22 Therapeutic Exercise: UBE L2 2 min each way Isometric shoulder ER 5 sec x 15 Pulleys  Supine OH pullovers with dowel.  Supine  horizontal ab and add with dowel  Supine red band horizontal abduction Supine red band ER  Supine alternating diagonals red band  Manual Therapy: Passive ROM flex, abduction, IR, ER  STW left posterior shoulder       PATIENT EDUCATION: Education details: Pt education on PT impairments, prognosis, and POC. Informed consent. Rationale for interventions, safe/appropriate HEP performance Person educated: Patient Education method: Explanation, Demonstration, Tactile cues, Verbal cues, and Handouts Education comprehension: verbalized understanding, returned demonstration, verbal cues required, tactile cues required, and needs further education     HOME EXERCISE PROGRAM: Access Code: [email or web address] URL: [email or web address] Date: 06/22/2022 Prepared by: Starr Lake  Exercises - Standing Isometric Shoulder External Rotation with Doorway  - 1 x daily - 7 x weekly - 3 sets - 5 reps - Supine Shoulder Horizontal Abduction with Resistance  - 1 x daily - 7 x weekly - 2 sets - 10 reps - Supine shoulder external rotation- KNEES BENT  - 1 x daily - 7 x weekly - 2 sets - 10 reps - Alternating star pattern  - 1 x daily - 7 x weekly - 2 sets - 10 reps - Standing Shoulder Scaption  - 1 x daily - 7 x weekly - 2 sets - 12 reps - Lower Trap Wall slides  - 1 x daily - 7 x weekly - 2 sets - 10 reps - Standing Sleeper Stretch at Wall  - 1 x daily - 7 x weekly - 2 sets - 2 reps - 30 seconds hold    ASSESSMENT:   CLINICAL IMPRESSION: Pt arrives to session reporting improvement in pain since starting PT. Pain today is 3/10 located at the infraspinatus. Educated and consent was provided for TPDN focusing on the L infraspinatus followed with IASTM techniques and scapular mobs. Continued working on scapular stability to promote scapulohumeral rhythm which he did well with. End of session he had full shoulder flexion/ abduction ROM with minimcal soreness likely related to DN.    OBJECTIVE  IMPAIRMENTS: decreased activity tolerance, decreased endurance, decreased mobility, decreased ROM, decreased strength, hypomobility, impaired UE functional use, and pain.    ACTIVITY LIMITATIONS: carrying, sitting, sleeping, and reach over head   PARTICIPATION LIMITATIONS: meal prep, cleaning, and community activity   PERSONAL FACTORS: Time since onset of injury/illness/exacerbation are also affecting patient's functional outcome.    REHAB POTENTIAL: Good   CLINICAL DECISION MAKING: Stable/uncomplicated   EVALUATION COMPLEXITY: Low     GOALS: Goals reviewed with patient? No   SHORT TERM GOALS: Target date: 06/29/2022   Pt  will demonstrate appropriate understanding and performance of initially prescribed HEP in order to facilitate improved independence with management of symptoms.  Baseline: HEP provided on eval Goal status: INITIAL    2. Pt will score greater than or equal to 61% on FOTO in order to demonstrate improved perception of function due to symptoms.            Baseline: 55%            Goal status: INITIAL     LONG TERM GOALS: Target date: 07/27/2022 Pt will score 67% on FOTO in order to demonstrate improved perception of function due to symptoms. Baseline: 55% Goal status: INITIAL   2.  Pt will demonstrate at least 165 degrees of active L shoulder elevation in order to demonstrate improved tolerance to functional movement patterns such as reaching overhead.  Baseline: see ROM chart above Goal status: INITIAL   3.  Pt will demonstrate at least 4+/5 shoulder ER/IR MMT for improved symmetry of UE strength and improved tolerance to functional movements.  Baseline: see MMT chart above Goal status: INITIAL   4. Pt will report/demonstrate ability to perform overhead reach with less than 2 point increase in pain on NPS in order to indicative improved tolerance/independence with functional tasks such as cleaning/dressing.            Baseline: up to 8/10 with daily activities             Goal status: INITIAL              5. Pt will report ability to sleep through the night with less than 2 interruptions due to L shoulder pain in order to facilitate improved overall health and QOL.                       Baseline: waking 2-3 times a night on average                      Goal status: INITIAL    PLAN:   PT FREQUENCY: 2x/week   PT DURATION: 8 weeks   PLANNED INTERVENTIONS: Therapeutic exercises, Therapeutic activity, Neuromuscular re-education, Balance training, Gait training, Patient/Family education, Self Care, Joint mobilization, Joint manipulation, Dry Needling, Electrical stimulation, Spinal manipulation, Spinal mobilization, Cryotherapy, Moist heat, Taping, Manual therapy, and Re-evaluation   PLAN FOR NEXT SESSION: review/update HEP. Manual as indicated, periscapular/GH strength/mobility as able/appropriate    Starr Lake PT, DPT, LAT, ATC  06/22/22  11:04 AM

## 2022-06-26 ENCOUNTER — Ambulatory Visit: Payer: Medicaid Other | Attending: Nurse Practitioner | Admitting: Physical Therapy

## 2022-06-26 ENCOUNTER — Encounter: Payer: Self-pay | Admitting: Physical Therapy

## 2022-06-26 DIAGNOSIS — M25512 Pain in left shoulder: Secondary | ICD-10-CM | POA: Diagnosis not present

## 2022-06-26 DIAGNOSIS — M25612 Stiffness of left shoulder, not elsewhere classified: Secondary | ICD-10-CM

## 2022-06-26 DIAGNOSIS — M6281 Muscle weakness (generalized): Secondary | ICD-10-CM | POA: Diagnosis not present

## 2022-06-26 DIAGNOSIS — G8929 Other chronic pain: Secondary | ICD-10-CM | POA: Diagnosis not present

## 2022-06-26 DIAGNOSIS — Z419 Encounter for procedure for purposes other than remedying health state, unspecified: Secondary | ICD-10-CM | POA: Diagnosis not present

## 2022-06-26 NOTE — Therapy (Signed)
OUTPATIENT PHYSICAL THERAPY TREATMENT NOTE   Patient Name: Jonathon Harris MRN: [id number] DOB:1975/05/27, 47 y.o., male Today's Date: 06/26/2022  PCP: Flossie Buffy, NP   REFERRING PROVIDER: Flossie Buffy, NP  END OF SESSION:   PT End of Session - 06/26/22 1012     Visit Number 5    Number of Visits 17    Date for PT Re-Evaluation 07/27/22    Authorization Type [employer]    Authorization Time Period 06/08/22-08/07/22    Authorization - Visit Number 3    Authorization - Number of Visits 10    PT Start Time [id number]    PT Stop Time 1055    PT Time Calculation (min) 40 min              Past Medical History:  Diagnosis Date   Elevated BP without diagnosis of hypertension 05/14/2016   Thyroid disease    hyperactive- no medications at this time   Past Surgical History:  Procedure Laterality Date   CYST EXCISION     chest   Patient Active Problem List   Diagnosis Date Noted   Pseudopolyposis of colon without complication, unspecified part of colon ([place]) 05/08/2022   Radiculopathy 02/07/2021   Benign colon polyp 08/02/2020   Low TSH level 03/15/2020   Prediabetes 02/09/2020   Family hx of colon cancer 02/01/2020   History of tobacco use 02/01/2020   Hyperlipidemia 05/12/2016   Chronic bilateral back pain 02/10/2016    REFERRING DIAG: M25.512,G89.29 (ICD-10-CM) - Chronic left shoulder pain 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 My pain is 2/10. The  Dry needling helped. Yesterday, the pain was only 1/10 but the rain today may be a factor. "   PAIN:  Are you having pain: yes, 2/10 Location/description: L shoulder, dull aching, pulling, deep Best-worst over past week: 2-6/10   Per eval -  - aggravating factors: sleeping on L, reaching overhead or to side, lifting heavy items, sitting for prolonged periods - Easing factors: movement, stretching, medication   OBJECTIVE: (objective measures completed at initial evaluation unless otherwise dated)   DIAGNOSTIC FINDINGS:  L GH XR unremarkable for acute  abnormalities per chart review   PATIENT SURVEYS:  FOTO: 55% current, 67% predicted    COGNITION: Overall cognitive status: Within functional limits for tasks assessed                                  SENSATION: WFL B UE   POSTURE: Grossly WNL, mild FHP   UPPER EXTREMITY ROM:   A/PROM Right eval Left eval Left 06/26/22  Shoulder flexion 178 deg 150 deg * 160 Pn   Shoulder abduction 175 deg 158 deg * 160  Shoulder internal rotation       Shoulder external rotation       Elbow flexion       Elbow extension       Wrist flexion       Wrist extension        (Blank rows = not tested) (Key: WFL = within functional limits not formally assessed, * = concordant pain, s = stiffness/stretching sensation, NT = not tested)  Comments:    UPPER EXTREMITY MMT:   MMT Right eval Left eval  Shoulder flexion 5 4 *  Shoulder extension      Shoulder abduction 5 4 *   Shoulder extension      Shoulder internal rotation 5 4 (elbow)  Shoulder external rotation 5 4 - *  Elbow flexion   5  Elbow extension   4+ (posterior shoulder pain)  Grip strength      (Blank rows = not tested)  (Key: WFL = within functional limits not formally assessed, * = concordant pain, s = stiffness/stretching sensation, NT = not tested)  Comments:      PALPATION:  Concordant tenderness L infraspinatus/teres, mild tenderness L LS, lat, deltoid, bicipital  groove             TODAY'S TREATMENT:                [facility] Adult PT Treatment:                                                DATE: 06/26/22 Therapeutic Exercise: UBE L2 x 2 minutes each way  Pulleys Green band Row Green band Ext  Left IR green x 20 Left ER green x 20  Bilat Y at wall x20 Sleeper stretch 20 sec x 2  Standing Green horizontal abduction Standing diagonals alternating 10 x 2 each  Standing bilat Scaption 3# 10 x 2  S/L ER 3# 10 x2 Cabinet reaching 3# middle shelf x 10- pain Manual Therapy: TPR infraspinatus and teres Scap mobs    [facility] Adult PT Treatment:                                                DATE: 06/22/2022 Therapeutic Exercise: UBE L5 x 4 min (FWD/BWD x 2 min ea) LUE upper trap/ levator scapulae stretch 1 x 30 sec Rhomboid stretch standing with arms crossed leaning back from free motion 1 x 30 sec Standing sleeper stretch 2 x 30 sec LUE  Shoulder Scaption 2 x 12 with 2# LUE Scapular retraction 2 x 12 with GTB  Updated HEP for standing sleeper stretch,  lower trap wall y's, and scaption strengthening.  Manual Therapy: IASTM along the L infraspinatus Scapular mobs in all planes except elevation grade IV  Trigger Point Dry-Needling  Treatment instructions: Expect mild to moderate muscle soreness. S/S of pneumothorax if dry needled over a lung field, and to seek immediate medical attention should they occur. Patient verbalized understanding of these instructions and education.  Patient Consent Given: Yes Education handout provided: Yes Muscles treated: infraspinatus on the L Electrical stimulation performed: Yes Parameters:  CPS 20 x 8 min adjusting to tolerance Treatment response/outcome: twtich response, reduction in pain.     [facility] Adult PT Treatment:                                                DATE: 06/09/22 Therapeutic Exercise: UBE L2 x 2 minutes each way  Pulleys  Standing red band IR and ER 10 x 2 each Supine OH pullovers 2# 10 x 2   Supine horizontal ab and add with dowel  Supine green  band horizontal abduction 10 x 2  Supine green  band ER 10 x 2  Supine alternating diagonals green  band  10 x 2   Manual Therapy: Passive ROM flex, abduction, IR, ER  STW left posterior shoulder    [facility] Adult PT Treatment:                                                DATE: 06/08/22 Therapeutic Exercise: UBE L2 2 min each way Isometric shoulder ER 5 sec x 15 Pulleys  Supine OH pullovers with dowel.  Supine horizontal ab and add with dowel  Supine red band horizontal abduction Supine red band ER  Supine alternating diagonals red band  Manual Therapy: Passive ROM flex, abduction, IR, ER  STW left posterior shoulder       PATIENT EDUCATION: Education details: Pt education on PT impairments, prognosis, and POC. Informed consent. Rationale for interventions, safe/appropriate HEP performance Person educated: Patient Education method: Explanation, Demonstration, Tactile cues, Verbal cues, and Handouts Education comprehension: verbalized understanding, returned demonstration, verbal cues required, tactile cues required, and needs further education     HOME EXERCISE PROGRAM: Access Code: [email or web address] URL: [email or web address] Date: 06/22/2022 Prepared by: Starr Lake  Exercises - Standing Isometric Shoulder External Rotation with Doorway  - 1 x daily - 7 x weekly - 3 sets - 5 reps - Supine Shoulder Horizontal Abduction with Resistance  - 1 x daily - 7 x weekly - 2 sets - 10 reps - Supine shoulder external rotation- KNEES BENT  - 1 x daily - 7 x weekly - 2 sets - 10 reps - Alternating star pattern  - 1 x daily - 7 x weekly - 2 sets - 10 reps - Standing Shoulder Scaption  - 1 x daily - 7 x weekly - 2 sets - 12 reps - Lower Trap Wall slides  - 1 x daily - 7 x weekly - 2 sets - 10 reps - Standing Sleeper Stretch at Wall  - 1 x daily - 7 x weekly - 2 sets - 2 reps - 30 seconds hold    ASSESSMENT:    CLINICAL IMPRESSION: Pt arrives to session reporting improvement in pain since starting  PT. He reports min soreness after TPDN with overall reduction in pain. He would like to try TPDN again. He does have 5/10 pain with shoulder flexion located posterior shoulder and AC joint. His AROM has improved. Continued working on scapular stability to promote scapulohumeral rhythm which he did well with.    OBJECTIVE IMPAIRMENTS: decreased activity tolerance, decreased endurance, decreased mobility, decreased ROM, decreased strength, hypomobility, impaired UE functional use, and pain.    ACTIVITY LIMITATIONS: carrying, sitting, sleeping, and reach over head   PARTICIPATION LIMITATIONS: meal prep, cleaning, and community activity   PERSONAL FACTORS: Time since onset of injury/illness/exacerbation are also affecting patient's functional outcome.    REHAB POTENTIAL: Good   CLINICAL DECISION MAKING: Stable/uncomplicated   EVALUATION COMPLEXITY: Low     GOALS: Goals reviewed with patient? No   SHORT TERM GOALS: Target date: 06/29/2022   Pt will demonstrate appropriate understanding and performance of initially prescribed HEP in order to facilitate improved independence with management of symptoms.  Baseline: HEP provided on eval Goal status: INITIAL    2. Pt will score greater than or equal to 61% on FOTO in order to demonstrate improved perception of function due to symptoms.            Baseline: 55%            Goal status: INITIAL     LONG TERM GOALS: Target date: 07/27/2022 Pt will score 67% on FOTO in order to demonstrate improved perception of function due to symptoms. Baseline: 55% Goal status: INITIAL   2.  Pt will demonstrate at least 165 degrees of active L shoulder elevation in order to demonstrate improved tolerance to functional movement patterns such as reaching overhead.  Baseline: see ROM chart above Goal status: INITIAL   3.  Pt will demonstrate at least 4+/5 shoulder ER/IR MMT  for improved symmetry of UE strength and improved tolerance to functional movements.  Baseline: see MMT chart above Goal status: INITIAL   4. Pt will report/demonstrate ability to perform overhead reach with less than 2 point increase in pain on NPS in order to indicative improved tolerance/independence with functional tasks such as cleaning/dressing.            Baseline: up to 8/10 with daily activities            Goal status: INITIAL              5. Pt will report ability to sleep through the night with less than 2 interruptions due to L shoulder pain in order to facilitate improved overall health and QOL.                       Baseline: waking 2-3 times a night on average                      Goal status: INITIAL    PLAN:   PT FREQUENCY: 2x/week   PT DURATION: 8 weeks   PLANNED INTERVENTIONS: Therapeutic exercises, Therapeutic activity, Neuromuscular re-education, Balance training, Gait training, Patient/Family education, Self Care, Joint mobilization, Joint manipulation, Dry Needling, Electrical stimulation, Spinal manipulation, Spinal mobilization, Cryotherapy, Moist heat, Taping, Manual therapy, and Re-evaluation   PLAN FOR NEXT SESSION: review/update HEP. Manual as indicated, periscapular/GH strength/mobility as able/appropriate , check FOTO and STGs, TPDN prn   Hessie Diener, PTA 06/26/22 11:18 AM Phone: [phone] Fax: [phone]

## 2022-06-30 ENCOUNTER — Telehealth: Payer: Self-pay | Admitting: Physical Therapy

## 2022-06-30 ENCOUNTER — Ambulatory Visit: Payer: Medicaid Other | Admitting: Physical Therapy

## 2022-06-30 NOTE — Telephone Encounter (Signed)
Called pt re: this morning's missed appointment - left voicemail with reminder of attendance policy, confirmed date/time of next appointment and left office call back number.

## 2022-07-03 ENCOUNTER — Encounter: Payer: Self-pay | Admitting: Physical Therapy

## 2022-07-03 ENCOUNTER — Ambulatory Visit: Payer: Medicaid Other | Admitting: Physical Therapy

## 2022-07-03 DIAGNOSIS — M25512 Pain in left shoulder: Secondary | ICD-10-CM | POA: Diagnosis not present

## 2022-07-03 DIAGNOSIS — G8929 Other chronic pain: Secondary | ICD-10-CM

## 2022-07-03 DIAGNOSIS — M6281 Muscle weakness (generalized): Secondary | ICD-10-CM | POA: Diagnosis not present

## 2022-07-03 DIAGNOSIS — M25612 Stiffness of left shoulder, not elsewhere classified: Secondary | ICD-10-CM

## 2022-07-03 NOTE — Therapy (Signed)
OUTPATIENT PHYSICAL THERAPY TREATMENT NOTE   Patient Name: Jonathon Harris MRN: [id number] DOB:22-Nov-1975, 47 y.o., male Today's Date: 07/03/2022  PCP: Flossie Buffy, NP   REFERRING PROVIDER: Flossie Buffy, NP  END OF SESSION:   PT End of Session - 07/03/22 1020     Visit Number 6    Number of Visits 17    Date for PT Re-Evaluation 07/27/22    Authorization Type [employer]    Authorization Time Period 06/08/22-08/07/22    Authorization - Visit Number 4    Authorization - Number of Visits 10    PT Start Time [id number]    PT Stop Time 1056    PT Time Calculation (min) 38 min              Past Medical History:  Diagnosis Date   Elevated BP without diagnosis of hypertension 05/14/2016   Thyroid disease    hyperactive- no medications at this time   Past Surgical History:  Procedure Laterality Date   CYST EXCISION     chest   Patient Active Problem List   Diagnosis Date Noted   Pseudopolyposis of colon without complication, unspecified part of colon ([place]) 05/08/2022   Radiculopathy 02/07/2021   Benign colon polyp 08/02/2020   Low TSH level 03/15/2020   Prediabetes 02/09/2020   Family hx of colon cancer 02/01/2020   History of tobacco use 02/01/2020   Hyperlipidemia 05/12/2016   Chronic bilateral back pain 02/10/2016    REFERRING DIAG: M25.512,G89.29 (ICD-10-CM) - Chronic left shoulder pain 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 Pain is still at top of shoulder blade and top of shoulder"    PAIN:  Are you having pain: yes, 6/10 Location/description: L shoulder, dull aching, pulling, deep Best-worst over past week: 2-6/10   Per eval -  - aggravating factors: sleeping on L, reaching overhead or to side, lifting heavy items, sitting for prolonged periods - Easing factors: movement, stretching, medication   OBJECTIVE: (objective measures completed at initial evaluation unless otherwise dated)   DIAGNOSTIC FINDINGS:  L GH XR unremarkable for  acute abnormalities per chart review   PATIENT SURVEYS:  FOTO: 55% current, 67% predicted    COGNITION: Overall cognitive status: Within functional limits for tasks assessed                                  SENSATION: WFL B UE   POSTURE: Grossly WNL, mild FHP   UPPER EXTREMITY ROM:   A/PROM Right eval Left eval Left 06/26/22  Shoulder flexion 178 deg 150 deg * 160 Pn   Shoulder abduction 175 deg 158 deg * 160  Shoulder internal rotation       Shoulder external rotation       Elbow flexion       Elbow extension       Wrist flexion       Wrist extension        (Blank rows = not tested) (Key: WFL = within functional limits not formally assessed, * = concordant pain, s = stiffness/stretching sensation, NT = not tested)  Comments:    UPPER EXTREMITY MMT:   MMT Right eval Left eval  Shoulder flexion 5 4 *  Shoulder extension      Shoulder abduction 5 4 *   Shoulder extension      Shoulder internal rotation 5 4 (elbow)  Shoulder external rotation 5 4 - *  Elbow flexion   5  Elbow extension   4+ (posterior shoulder pain)  Grip strength      (Blank rows = not tested)  (Key: WFL = within functional limits not formally assessed, * = concordant pain, s = stiffness/stretching sensation, NT = not tested)  Comments:      PALPATION:  Concordant tenderness L infraspinatus/teres, mild tenderness L LS, lat, deltoid,  bicipital groove             TODAY'S TREATMENT:                [facility] Adult PT Treatment:                                                DATE: 07/03/22 Therapeutic Exercise: UBE L2 x 3 minutes each way LUE upper trap/ levator scapulae stretch 1 x 30 sec Rhomboid stretch standing with arms crossed leaning back from free motion 1 x 30 sec Standing sleeper stretch 2 x 30 sec LUE  Lower trap wall slides  IR/ER green band 20 x 2 each towel on side  Manual Therapy: STW periscap left, upper trap left  Trigger Point Dry-Needling - performed by Carlus Pavlov , DPT Treatment instructions: Expect mild to moderate muscle soreness. S/S of pneumothorax if dry needled over a lung field, and to seek immediate medical attention should they occur. Patient verbalized understanding of these instructions and education.  Patient Consent Given: Yes Education handout provided: Yes Muscles treated: infraspinatus on the L, upper trap on the left  Electrical stimulation performed: no Treatment response/outcome: twtich response, reduction in pain.   [facility] Adult PT Treatment:                                                DATE: 06/26/22 Therapeutic Exercise: UBE L2 x  2 minutes each way  Pulleys Green band Row Green band Ext  Left IR green x 20 Left ER green x 20  Bilat Y at wall x20 Sleeper stretch 20 sec x 2  Standing Green horizontal abduction Standing diagonals alternating 10 x 2 each  Standing bilat Scaption 3# 10 x 2  S/L ER 3# 10 x2 Cabinet reaching 3# middle shelf x 10- pain Manual Therapy: TPR infraspinatus and teres Scap mobs    [facility] Adult PT Treatment:                                                DATE: 06/22/2022 Therapeutic Exercise: UBE L5 x 4 min (FWD/BWD x 2 min ea) LUE upper trap/ levator scapulae stretch 1 x 30 sec Rhomboid stretch standing with arms crossed leaning back from free motion 1 x 30 sec Standing sleeper stretch 2 x 30 sec LUE  Shoulder Scaption 2 x 12 with 2# LUE Scapular  retraction 2 x 12 with GTB  Updated HEP for standing sleeper stretch, lower trap wall y's, and scaption strengthening.  Manual Therapy: IASTM along the L infraspinatus Scapular mobs in all planes except elevation grade IV  Trigger Point Dry-Needling  Treatment instructions: Expect mild to moderate muscle soreness. S/S of pneumothorax if dry needled over a lung field, and to seek immediate medical attention should they occur. Patient verbalized understanding of these instructions and education.  Patient Consent Given: Yes Education handout provided: Yes Muscles treated: infraspinatus on the L Electrical stimulation performed: Yes Parameters:  CPS 20 x 8 min adjusting to tolerance Treatment response/outcome: twtich response, reduction in pain.           PATIENT EDUCATION: Education details: Pt education on PT impairments, prognosis, and POC. Informed consent. Rationale for interventions, safe/appropriate HEP performance Person educated: Patient Education method: Explanation, Demonstration, Tactile cues, Verbal cues, and Handouts Education comprehension: verbalized understanding, returned demonstration, verbal cues required, tactile cues required, and needs further education     HOME EXERCISE PROGRAM: Access Code: [email or web address] URL: [email or web address] Date: 06/22/2022 Prepared by: Starr Lake  Exercises - Standing Isometric Shoulder External Rotation with Doorway  - 1 x daily - 7 x weekly - 3 sets - 5 reps - Supine Shoulder Horizontal Abduction with Resistance  - 1 x daily - 7 x weekly - 2 sets - 10 reps - Supine shoulder external rotation- KNEES BENT  - 1 x daily - 7 x weekly - 2 sets - 10 reps - Alternating star pattern  - 1 x daily - 7 x weekly - 2 sets - 10 reps - Standing Shoulder Scaption  - 1 x daily - 7 x weekly - 2 sets - 12 reps - Lower Trap Wall slides  - 1 x daily - 7 x weekly - 2 sets - 10 reps - Standing Sleeper Stretch at Wall  - 1 x daily - 7 x  weekly - 2 sets - 2 reps - 30 seconds hold    ASSESSMENT:   CLINICAL IMPRESSION: Pt arrives to session reporting increased shoulder pain to 6/10 after lifting grand daughter as part of dance routine. Trigger point dry needling performed to infraspinatus and upper trap (performed by Carlus Pavlov, DPT) followed by instruction in post needling stretches.  Continued working on scapular stability to promote scapulohumeral rhythm which he did well with. After session he reported  soreness from TPDN. HMP applied to assist with soreness.    OBJECTIVE IMPAIRMENTS: decreased activity tolerance, decreased endurance, decreased mobility, decreased ROM, decreased strength, hypomobility, impaired UE functional use, and pain.    ACTIVITY LIMITATIONS: carrying, sitting, sleeping, and reach over head   PARTICIPATION LIMITATIONS: meal prep, cleaning, and community activity   PERSONAL FACTORS: Time since onset of injury/illness/exacerbation are also affecting patient's functional outcome.    REHAB POTENTIAL: Good   CLINICAL DECISION MAKING: Stable/uncomplicated   EVALUATION COMPLEXITY: Low     GOALS: Goals reviewed with patient? No   SHORT TERM GOALS: Target date: 06/29/2022   Pt will demonstrate appropriate understanding and performance of initially prescribed HEP in order to facilitate improved independence with management of symptoms.  Baseline: HEP provided on eval 07/03/22: I and compliant Goal status: MET    2. Pt will score greater than or equal to 61% on FOTO in order to demonstrate improved perception of function due to symptoms.            Baseline: 55%  07/03/22: deferred due to pain increase             Goal status: ONGOING     LONG TERM GOALS: Target date: 07/27/2022 Pt will score 67% on FOTO in order to demonstrate improved perception of function due to symptoms. Baseline: 55% Goal status: INITIAL   2.  Pt will demonstrate at least 165 degrees of active L shoulder elevation in order to  demonstrate improved tolerance to functional movement patterns such as reaching overhead.  Baseline: see ROM chart above Goal status: INITIAL   3.  Pt will demonstrate at least 4+/5 shoulder ER/IR MMT for improved symmetry of UE strength and improved tolerance to functional movements.  Baseline: see MMT chart above Goal status: INITIAL   4. Pt will report/demonstrate ability to perform overhead reach with less than 2 point increase in pain on NPS in order to indicative improved tolerance/independence with functional tasks such as cleaning/dressing.            Baseline: up to 8/10 with daily activities            Goal status: INITIAL              5. Pt will report ability to sleep through the night with less than 2 interruptions due to L shoulder pain in order to facilitate improved overall health and QOL.                       Baseline: waking 2-3 times a night on average                      Goal status: INITIAL    PLAN:   PT FREQUENCY: 2x/week   PT DURATION: 8 weeks   PLANNED INTERVENTIONS: Therapeutic exercises, Therapeutic activity, Neuromuscular re-education, Balance training, Gait training, Patient/Family education, Self Care, Joint mobilization, Joint manipulation, Dry Needling, Electrical stimulation, Spinal manipulation, Spinal mobilization, Cryotherapy, Moist heat, Taping, Manual therapy, and Re-evaluation   PLAN FOR NEXT SESSION: review/update HEP. Manual as indicated, periscapular/GH strength/mobility as able/appropriate , check FOTO and STGs, TPDN prn, assess response to [facility]    Hessie Diener, PTA 07/03/22 10:50 AM Phone: [phone] Fax: [phone]

## 2022-07-17 NOTE — Therapy (Signed)
OUTPATIENT PHYSICAL THERAPY TREATMENT NOTE   Patient Name: Jonathon Harris MRN: [id number] DOB:Jan 07, 1976, 47 y.o., male Today's Date: 07/17/2022  PCP: Flossie Buffy, NP   REFERRING PROVIDER: Flossie Buffy, NP  END OF SESSION:      Past Medical History:  Diagnosis Date   Elevated BP without diagnosis of hypertension 05/14/2016   Thyroid disease    hyperactive- no medications at this time   Past Surgical History:  Procedure Laterality Date   CYST EXCISION     chest   Patient Active Problem List   Diagnosis Date Noted   Pseudopolyposis of colon without complication, unspecified part of colon ([place]) 05/08/2022   Radiculopathy 02/07/2021   Benign colon polyp 08/02/2020   Low TSH level 03/15/2020   Prediabetes 02/09/2020   Family hx of colon cancer 02/01/2020   History of tobacco use 02/01/2020   Hyperlipidemia 05/12/2016   Chronic bilateral back pain 02/10/2016    REFERRING DIAG: M25.512,G89.29 (ICD-10-CM) - Chronic left shoulder pain 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  Rationale for Evaluation and Treatment Rehabilitation  PERTINENT HISTORY: Thyroid, hx tobacco  PRECAUTIONS: none  SUBJECTIVE:                                                                                                                                                                                      SUBJECTIVE STATEMENT:  "I lifted my granddaughters as part of their dance routine and my shoulder has been flared up ever since. Pain is still at top of shoulder blade and top of shoulder"    PAIN:  Are you having pain: yes, 6/10 Location/description: L shoulder, dull aching, pulling, deep Best-worst over past week: 2-6/10   Per eval -  - aggravating factors: sleeping on L, reaching overhead or to side, lifting heavy items, sitting for prolonged periods - Easing factors: movement, stretching, medication   OBJECTIVE: (objective  measures completed at initial evaluation unless otherwise dated)   DIAGNOSTIC FINDINGS:  L GH XR unremarkable for acute abnormalities per chart review   PATIENT SURVEYS:  FOTO: 55% current, 67% predicted    COGNITION: Overall cognitive status: Within functional limits for tasks assessed                                  SENSATION: WFL B UE   POSTURE: Grossly WNL, mild FHP   UPPER EXTREMITY ROM:   A/PROM Right eval Left eval Left 06/26/22  Shoulder flexion 178 deg 150 deg * 160 Pn   Shoulder abduction 175 deg 158 deg * 160  Shoulder internal rotation       Shoulder external rotation       Elbow flexion       Elbow extension       Wrist flexion       Wrist extension        (Blank rows = not tested) (Key: WFL = within functional limits not formally assessed, * = concordant pain, s = stiffness/stretching sensation, NT = not tested)  Comments:    UPPER EXTREMITY MMT:   MMT Right eval Left eval  Shoulder flexion 5 4 *  Shoulder extension      Shoulder abduction 5 4 *   Shoulder extension      Shoulder internal rotation 5 4 (elbow)  Shoulder external rotation 5 4 - *  Elbow flexion   5  Elbow extension   4+ (posterior shoulder pain)  Grip strength      (Blank rows = not tested)  (Key: WFL = within functional limits not formally assessed, * = concordant pain, s = stiffness/stretching sensation, NT = not tested)  Comments:      PALPATION:  Concordant tenderness L infraspinatus/teres, mild tenderness L LS, lat, deltoid, bicipital groove             TODAY'S TREATMENT:                [facility] Adult PT Treatment:                                                DATE: 07/03/22 Therapeutic Exercise: UBE L2 x 3 minutes each way LUE upper trap/ levator scapulae stretch 1 x 30 sec Rhomboid stretch standing with arms crossed leaning back from free motion 1 x 30 sec Standing sleeper stretch 2 x 30 sec LUE  Lower trap wall slides  IR/ER green band 20 x 2 each towel on side  Manual  Therapy: STW periscap left, upper trap left  Trigger Point Dry-Needling - performed by Carlus Pavlov , DPT Treatment instructions: Expect mild to moderate muscle soreness. S/S of pneumothorax if dry needled over a lung field, and to seek immediate medical attention should they occur. Patient verbalized understanding of these instructions and education.  Patient Consent Given: Yes Education handout provided: Yes Muscles treated: infraspinatus on the L, upper trap on the left  Electrical stimulation performed: no Treatment response/outcome: twtich response, reduction in pain.   [facility] Adult PT Treatment:                                                DATE: 06/26/22 Therapeutic Exercise: UBE L2 x 2 minutes each way  Pulleys Green band Row Green band Ext  Left IR green x 20 Left ER green x 20  Bilat Y at wall x20 Sleeper stretch 20 sec x 2  Standing Green horizontal abduction Standing diagonals alternating 10 x 2 each  Standing bilat Scaption 3# 10 x 2  S/L ER 3# 10 x2 Cabinet reaching 3# middle shelf x 10- pain Manual Therapy: TPR infraspinatus and teres Scap mobs       PATIENT EDUCATION: Education details: rationale for interventions Person educated: Patient Education method: Explanation, Demonstration, Tactile cues, Verbal cues, and Handouts Education comprehension: verbalized understanding,  returned demonstration, verbal cues required, tactile cues required, and needs further education     HOME EXERCISE PROGRAM: Access Code: [email or web address] URL: [email or web address] Date: 06/22/2022 Prepared by: Starr Lake  Exercises - Standing Isometric Shoulder External Rotation with Doorway  - 1 x daily - 7 x weekly - 3 sets - 5 reps - Supine Shoulder Horizontal Abduction with Resistance  - 1 x daily - 7 x weekly - 2 sets - 10 reps - Supine shoulder external rotation- KNEES BENT  - 1 x daily - 7 x weekly - 2 sets - 10 reps - Alternating star pattern  - 1 x daily - 7 x  weekly - 2 sets - 10 reps - Standing Shoulder Scaption  - 1 x daily - 7 x weekly - 2 sets - 12 reps - Lower Trap Wall slides  - 1 x daily - 7 x weekly - 2 sets - 10 reps - Standing Sleeper Stretch at Wall  - 1 x daily - 7 x weekly - 2 sets - 2 reps - 30 seconds hold    ASSESSMENT:   CLINICAL IMPRESSION: ***  *** Pt arrives to session reporting increased shoulder pain to 6/10 after lifting grand daughter as part of dance routine. Trigger point dry needling performed to infraspinatus and upper trap (performed by Carlus Pavlov, DPT) followed by instruction in post needling stretches.  Continued working on scapular stability to promote scapulohumeral rhythm which he did well with. After session he reported soreness from TPDN. HMP applied to assist with soreness.    OBJECTIVE IMPAIRMENTS: decreased activity tolerance, decreased endurance, decreased mobility, decreased ROM, decreased strength, hypomobility, impaired UE functional use, and pain.    ACTIVITY LIMITATIONS: carrying, sitting, sleeping, and reach over head   PARTICIPATION LIMITATIONS: meal prep, cleaning, and community activity   PERSONAL FACTORS: Time since onset of injury/illness/exacerbation are also affecting patient's functional outcome.    REHAB POTENTIAL: Good   CLINICAL DECISION MAKING: Stable/uncomplicated   EVALUATION COMPLEXITY: Low     GOALS: Goals reviewed with patient? No   SHORT TERM GOALS: Target date: 06/29/2022   Pt will demonstrate appropriate understanding and performance of initially prescribed HEP in order to facilitate improved independence with management of symptoms.  Baseline: HEP provided on eval 07/03/22: I and compliant Goal status: MET    2. Pt will score greater than or equal to 61% on FOTO in order to demonstrate improved perception of function due to symptoms.            Baseline: 55%  07/03/22: deferred due to pain increase             Goal status: ONGOING     LONG TERM GOALS: Target date:  07/27/2022 Pt will score 67% on FOTO in order to demonstrate improved perception of function due to symptoms. Baseline: 55% Goal status: INITIAL   2.  Pt will demonstrate at least 165 degrees of active L shoulder elevation in order to demonstrate improved tolerance to functional movement patterns such as reaching overhead.  Baseline: see ROM chart above Goal status: INITIAL   3.  Pt will demonstrate at least 4+/5 shoulder ER/IR MMT for improved symmetry of UE strength and improved tolerance to functional movements.  Baseline: see MMT chart above Goal status: INITIAL   4. Pt will report/demonstrate ability to perform overhead reach with less than 2 point increase in pain on NPS in order to indicative improved tolerance/independence with functional tasks such as cleaning/dressing.  Baseline: up to 8/10 with daily activities            Goal status: INITIAL              5. Pt will report ability to sleep through the night with less than 2 interruptions due to L shoulder pain in order to facilitate improved overall health and QOL.                       Baseline: waking 2-3 times a night on average                      Goal status: INITIAL    PLAN:   PT FREQUENCY: 2x/week   PT DURATION: 8 weeks   PLANNED INTERVENTIONS: Therapeutic exercises, Therapeutic activity, Neuromuscular re-education, Balance training, Gait training, Patient/Family education, Self Care, Joint mobilization, Joint manipulation, Dry Needling, Electrical stimulation, Spinal manipulation, Spinal mobilization, Cryotherapy, Moist heat, Taping, Manual therapy, and Re-evaluation   PLAN FOR NEXT SESSION: review/update HEP. Manual as indicated, periscapular/GH strength/mobility as able/appropriate , check FOTO and STGs, TPDN prn, assess response to TPDN ***    Leeroy Cha PT, DPT 07/17/2022 9:33 AM

## 2022-07-20 ENCOUNTER — Ambulatory Visit: Payer: Medicaid Other | Admitting: Physical Therapy

## 2022-07-20 ENCOUNTER — Encounter: Payer: Self-pay | Admitting: Physical Therapy

## 2022-07-20 DIAGNOSIS — M25612 Stiffness of left shoulder, not elsewhere classified: Secondary | ICD-10-CM

## 2022-07-20 DIAGNOSIS — G8929 Other chronic pain: Secondary | ICD-10-CM | POA: Diagnosis not present

## 2022-07-20 DIAGNOSIS — M6281 Muscle weakness (generalized): Secondary | ICD-10-CM

## 2022-07-20 DIAGNOSIS — M25512 Pain in left shoulder: Secondary | ICD-10-CM | POA: Diagnosis not present

## 2022-07-27 DIAGNOSIS — Z419 Encounter for procedure for purposes other than remedying health state, unspecified: Secondary | ICD-10-CM | POA: Diagnosis not present

## 2022-07-28 NOTE — Therapy (Signed)
OUTPATIENT PHYSICAL THERAPY TREATMENT NOTE   Patient Name: Jonathon Harris MRN: [id number] DOB:July 05, 1975, 47 y.o., male Today's Date: 07/29/2022  PCP: Jonathon Buffy, NP   REFERRING PROVIDER: Flossie Buffy, NP  END OF SESSION:   PT End of Session - 07/29/22 0933     Visit Number 8    Number of Visits 17    Date for PT Re-Evaluation 09/14/22    Authorization Type [employer]    Authorization Time Period 06/08/22-08/07/22    Authorization - Visit Number 6    Authorization - Number of Visits 10    PT Start Time 0933   late check in   PT Stop Time 1011    PT Time Calculation (min) 38 min    Activity Tolerance Patient tolerated treatment well    Behavior During Therapy [facility] for tasks assessed/performed                Past Medical History:  Diagnosis Date   Elevated BP without diagnosis of hypertension 05/14/2016   Thyroid disease    hyperactive- no medications at this time   Past Surgical History:  Procedure Laterality Date   CYST EXCISION     chest   Patient Active Problem List   Diagnosis Date Noted   Pseudopolyposis of colon without complication, unspecified part of colon 05/08/2022   Radiculopathy 02/07/2021   Benign colon polyp 08/02/2020   Low TSH level 03/15/2020   Prediabetes 02/09/2020   Family hx of colon cancer 02/01/2020   History of tobacco use 02/01/2020   Hyperlipidemia 05/12/2016   Chronic bilateral back pain 02/10/2016    REFERRING DIAG: M25.512,G89.29 (ICD-10-CM) - Chronic left shoulder pain 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   PAIN:  Are you having pain: yes, 3/10 Location/description: L shoulder, dull aching, pulling, deep Best-worst over past week: 0-6/10 ( on eval 2-6/10 ) Per eval -  - aggravating factors: sleeping on L, reaching overhead or to side, lifting heavy items, sitting for prolonged periods - Easing factors: movement, stretching, medication   OBJECTIVE: (objective measures completed at initial  evaluation unless otherwise dated)   DIAGNOSTIC FINDINGS:  L GH XR unremarkable for acute abnormalities per chart review   PATIENT SURVEYS:  FOTO: 55% current, 67% predicted  07/20/22 FOTO 55%   COGNITION: Overall cognitive status: Within functional limits for tasks assessed                                  SENSATION: WFL B UE   POSTURE: Grossly WNL, mild FHP   UPPER EXTREMITY ROM:   A/PROM Right eval Left eval Left 06/26/22 Left 06/2522 AROM  Shoulder flexion 178 deg 150 deg * 160 Pn  161 deg mild pain at end range  Shoulder abduction 175 deg 158 deg * 160 160 deg p!  Shoulder internal rotation        Shoulder external rotation        Elbow flexion        Elbow extension        Wrist flexion        Wrist extension         (Blank rows = not tested) (Key: WFL = within functional limits not formally assessed, * = concordant pain, s = stiffness/stretching sensation, NT = not tested)  Comments:    UPPER EXTREMITY MMT:   MMT Right eval Left eval R/L  07/20/22  Shoulder flexion 5 4 * 5/4+ p!  Shoulder extension       Shoulder abduction 5 4 *  5/4+ p!  Shoulder extension       Shoulder internal rotation 5 4 (elbow) 5/4+ p!  Shoulder external rotation 5 4 - * 5/4+ p!  Elbow flexion   5   Elbow extension   4+ (posterior shoulder pain)   Grip strength       (Blank rows = not tested)  (Key: WFL = within  functional limits not formally assessed, * = concordant pain, s = stiffness/stretching sensation, NT = not tested)  Comments:      PALPATION:  Concordant tenderness L infraspinatus/teres, mild tenderness L LS, lat, deltoid, bicipital groove             TODAY'S TREATMENT:                [facility] Adult PT Treatment:                                                DATE: 07/29/22 Therapeutic Exercise: Swiss ball flexion up wall 2x12 cues for comfortable ROM  Swiss ball perturbation 3x30sec at eye level cues for setup RTB around wrist BIL scaption 2x8 cues for posture and pacing  High>low row CC 7# unilat, 2x10 cues for setup HEP update as above, provided BTB for self progression PRN  Manual Therapy: Sidelying (L side up) STM and trigger point release L rhomboid, mid trap, lower trap, UT, LS   [facility] Adult PT Treatment:                                                DATE: 07/20/22 Therapeutic Exercise: Rhomboid stretch doorway 3x30sec LUE cues for setup CC unilat row LUE 7# 2x8 cues for shoulder angle and scap mechanics CC  unilat high>low row LUE 7# 2x8 cues for form and comfortable ROM   Manual Therapy: Sidelying (L side up) STM and trigger point release L rhomboid, mid trap, lower trap, UT, LS  Therapeutic Activity: MSK assessment + education FOTO + education   PATIENT EDUCATION: Education details: rationale for interventions, HEP update Person educated: Patient Education method: Explanation, Demonstration, Tactile cues, Verbal cues, and Handouts Education comprehension: verbalized understanding, returned demonstration, verbal cues required, tactile cues required, and needs further education     HOME EXERCISE PROGRAM: Access Code: [email or web address] URL: [email or web address] Date: 07/29/2022 Prepared by: Enis Slipper  Program Notes - add red band around wrist for scaption exercise- provided blue band on 07/29/22 to progress resistance   Exercises - Standing Isometric Shoulder  External Rotation with Doorway  - 1 x daily - 7 x weekly - 3 sets - 5 reps - Supine Shoulder Horizontal Abduction with Resistance  - 1 x daily - 7 x weekly - 2 sets - 10 reps - Supine shoulder external rotation- KNEES BENT  - 1 x daily - 7 x weekly - 2 sets - 10 reps - Alternating star pattern  - 1 x daily - 7 x weekly - 2 sets - 10 reps - Standing Shoulder Scaption  - 1 x daily - 7 x weekly - 2 sets - 12 reps - Lower Trap Wall slides  - 1 x daily - 7 x weekly - 2 sets - 10 reps - Standing Sleeper Stretch at Wall  - 1 x daily - 7 x weekly - 2 sets - 2 reps - 30 seconds hold    ASSESSMENT:   CLINICAL IMPRESSION: Pt arrives w/ 3/10 pain, no new updates. Continues to progress shoulder mobility/endurance and periscapular stability/strengthening. No adverse events, primary complaint of muscular fatigue although with repeated exercise rhomboid pain does become a bit irritated (up to 4/10). Improves with manual at end of session, departs with 3/10 pain mostly in rhomboid. HEP updated as above, provided with RTB for scaption and blue band for progression of other exercises PRN. No adverse events. Pt departs today's session in no acute distress, all voiced questions/concerns addressed appropriately from PT perspective.      OBJECTIVE IMPAIRMENTS: decreased activity tolerance, decreased endurance, decreased mobility, decreased ROM, decreased strength, hypomobility, impaired UE functional use, and pain.    ACTIVITY LIMITATIONS: carrying, sitting, sleeping, and reach over head   PARTICIPATION LIMITATIONS: meal prep, cleaning, and community activity   PERSONAL FACTORS: Time since onset of injury/illness/exacerbation are also affecting patient's functional outcome.    REHAB POTENTIAL: Good   CLINICAL DECISION MAKING: Stable/uncomplicated   EVALUATION COMPLEXITY: Low     GOALS: Goals reviewed with patient? No   SHORT TERM GOALS: Target date: 06/29/2022   Pt will demonstrate appropriate  understanding and performance of initially prescribed HEP in order to facilitate improved independence with management of symptoms.  Baseline: HEP provided on eval 07/03/22: I and compliant Goal status: MET    2. Pt will score greater than or equal to 61% on FOTO in order to demonstrate improved perception of function due to symptoms.            Baseline: 55%  07/03/22: deferred due to pain increase   07/20/22: 55%            Goal status: ONGOING     LONG TERM GOALS: Target date: 08/17/2022  (Updated 07/20/22) Pt will score 67% on FOTO in order to demonstrate improved perception of function due to symptoms.  Baseline: 55% 07/20/22: 55% Goal status: ONGOING   2.  Pt will demonstrate at least 165 degrees of active L shoulder elevation in order to demonstrate improved tolerance to functional movement patterns such as reaching overhead.  Baseline: see ROM chart above 07/20/22: 161 deg flex, 160 abd Goal status: PROGRESSING   3.  Pt will demonstrate at least 4+/5 shoulder ER/IR MMT for improved symmetry of UE strength and improved tolerance to functional movements.  Baseline: see MMT chart above 07/20/22: 4+/5 rotational MMT but painful Goal status: NEARLY MET   4. Pt will report/demonstrate ability to perform overhead reach with less than 2 point increase in pain on NPS in order to indicative improved tolerance/independence with functional tasks such as cleaning/dressing.            Baseline: up to 8/10 with daily activities  07/20/22: continues to have increase in pain w/ overhead activities, up to 6/10             Goal status: PROGRESSING             5. Pt will report ability to sleep through the night with less than 2 interruptions due to L shoulder pain in order to facilitate improved overall health and QOL.                       Baseline: waking 2-3 times a night on average   07/20/22: pt states sleep has much improved since starting PT, wakes him up a couple times a week                       Goal status: MET     PLAN (updated 07/20/22):   PT FREQUENCY: 2x/week     PT DURATION: 4 weeks   PLANNED INTERVENTIONS: Therapeutic exercises, Therapeutic activity, Neuromuscular re-education, Balance training, Gait training, Patient/Family education, Self Care, Joint mobilization, Joint manipulation, Dry Needling, Electrical stimulation, Spinal manipulation, Spinal mobilization, Cryotherapy, Moist heat, Taping, Manual therapy, and Re-evaluation   PLAN FOR NEXT SESSION: review/update HEP PRN. Continue with periscapular/GH strengthening. Pt interested in continuing more needling     Leeroy Cha PT, DPT 07/29/2022 12:35 PM

## 2022-07-29 ENCOUNTER — Ambulatory Visit: Payer: Medicaid Other | Attending: Nurse Practitioner | Admitting: Physical Therapy

## 2022-07-29 ENCOUNTER — Encounter: Payer: Self-pay | Admitting: Physical Therapy

## 2022-07-29 DIAGNOSIS — M25612 Stiffness of left shoulder, not elsewhere classified: Secondary | ICD-10-CM

## 2022-07-29 DIAGNOSIS — M6281 Muscle weakness (generalized): Secondary | ICD-10-CM | POA: Insufficient documentation

## 2022-07-29 DIAGNOSIS — G8929 Other chronic pain: Secondary | ICD-10-CM

## 2022-07-29 DIAGNOSIS — M25512 Pain in left shoulder: Secondary | ICD-10-CM | POA: Insufficient documentation

## 2022-07-30 NOTE — Therapy (Signed)
OUTPATIENT PHYSICAL THERAPY TREATMENT NOTE   Patient Name: Mattie MarlinJason L Mcdanel MRN: [id number] DOB:[birth year], 47 y.o., male Today's Date: 07/31/2022  PCP: Anne NgNche, Charlotte Lum, NP   REFERRING PROVIDER: Anne NgNche, Charlotte Lum, NP  END OF SESSION:   PT End of Session - 07/31/22 1100     Visit Number 9    Number of Visits 17    Date for PT Re-Evaluation 09/14/22    Authorization Type [employer]    Authorization Time Period 06/08/22-08/07/22    Authorization - Visit Number 7    Authorization - Number of Visits 10    PT Start Time 1100    PT Stop Time 1140    PT Time Calculation (min) 40 min    Activity Tolerance Patient tolerated treatment well;No increased pain    [facility]             Past Medical History:  Diagnosis Date   Elevated BP without diagnosis of hypertension 05/14/2016   Thyroid disease    hyperactive- no medications at this time   Past Surgical History:  Procedure Laterality Date   CYST EXCISION     chest   Patient Active Problem List   Diagnosis Date Noted   Pseudopolyposis of colon without complication, unspecified part of colon 05/08/2022   Radiculopathy 02/07/2021   Benign colon polyp 08/02/2020   Low TSH level 03/15/2020   Prediabetes 02/09/2020   Family hx of colon cancer 02/01/2020   History of tobacco use 02/01/2020   Hyperlipidemia 05/12/2016   Chronic bilateral back pain 02/10/2016    REFERRING DIAG: M25.512,G89.29 (ICD-10-CM) - Chronic left shoulder pain 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 A little more pain today, 4/10, which he attributes to having gone to gym yesterday. Did some upper body/core work, some aggravation with lat pull downs  PAIN:  Are you having pain: yes,  4/10 Location/description: L shoulder, dull aching, pulling, deep Best-worst over past week: 0-6/10 ( on eval 2-6/10 ) Per eval -  - aggravating factors: sleeping on L, reaching overhead or to side, lifting heavy items, sitting for prolonged periods - Easing factors:  movement, stretching, medication   OBJECTIVE: (objective measures completed at initial evaluation unless otherwise dated)   DIAGNOSTIC FINDINGS:  L GH XR unremarkable for acute abnormalities per chart review   PATIENT SURVEYS:  FOTO: 55% current, 67% predicted  07/20/22 FOTO 55%   COGNITION: Overall cognitive status: Within functional limits for tasks assessed                                  SENSATION: WFL B UE   POSTURE: Grossly WNL, mild FHP   UPPER EXTREMITY ROM:   A/PROM Right eval Left eval Left 06/26/22 Left 06/2522 AROM  Shoulder flexion 178 deg 150 deg * 160 Pn  161 deg mild pain at end range  Shoulder abduction 175 deg 158 deg * 160 160 deg p!  Shoulder internal rotation        Shoulder external rotation        Elbow flexion        Elbow extension        Wrist flexion        Wrist extension         (Blank rows = not tested) (Key: WFL = within functional limits not formally assessed, * = concordant pain, s = stiffness/stretching sensation, NT = not tested)  Comments:    UPPER EXTREMITY MMT:   MMT Right eval Left eval R/L  07/20/22  Shoulder flexion 5 4 * 5/4+ p!  Shoulder extension       Shoulder abduction 5 4 *  5/4+ p!  Shoulder extension       Shoulder internal rotation 5 4 (elbow) 5/4+ p!  Shoulder external rotation 5 4 - * 5/4+ p!  Elbow flexion   5   Elbow extension   4+ (posterior shoulder  pain)   Grip strength       (Blank rows = not tested)  (Key: WFL = within functional limits not formally assessed, * = concordant pain, s = stiffness/stretching sensation, NT = not tested)  Comments:      PALPATION:  Concordant tenderness L infraspinatus/teres, mild tenderness L LS, lat, deltoid, bicipital groove             TODAY'S TREATMENT:                [facility] Adult PT Treatment:                                                DATE: 07/31/22 Therapeutic Exercise: Swiss ball up wall 2x12 cues for setup and extra lean for ROM/T spine ext Swiss ball perturbation 2x45sec at eye level, cues for form/setup High>low row 13# unilat 2x8 cues for form  RTB closed chain reaches at wall, 3 way, 2x5 laps BIL cues for form and setup Lat pull down 30# 2x to fatigue cues for trunk angle which improves tolerance  Manual Therapy: Sidelying (L side up) STM and trigger point release L rhomboid, mid trap, lower trap, UT, LS. Active/passive pin and stretch trigger point rhomboid/middle trap    [facility] Adult PT Treatment:  DATE: 07/29/22 Therapeutic Exercise: Swiss ball flexion up wall 2x12 cues for comfortable ROM  Swiss ball perturbation 3x30sec at eye level cues for setup RTB around wrist BIL scaption 2x8 cues for posture and pacing  High>low row CC 7# unilat, 2x10 cues for setup HEP update as above, provided BTB for self progression PRN  Manual Therapy: Sidelying (L side up) STM and trigger point release L rhomboid, mid trap, lower trap, UT, LS    PATIENT EDUCATION: Education details: rationale for interventions, HEP update Person educated: Patient Education method: Explanation, Demonstration, Tactile cues, Verbal cues, and Handouts Education comprehension: verbalized understanding, returned demonstration, verbal cues required, tactile cues required, and needs further education     HOME EXERCISE PROGRAM: Access Code: [email or web address] URL:  [email or web address] Date: 07/29/2022 Prepared by: Fransisco Hertzavid Fausto Sampedro  Program Notes - add red band around wrist for scaption exercise- provided blue band on 07/29/22 to progress resistance   Exercises - Standing Isometric Shoulder External Rotation with Doorway  - 1 x daily - 7 x weekly - 3 sets - 5 reps - Supine Shoulder Horizontal Abduction with Resistance  - 1 x daily - 7 x weekly - 2 sets - 10 reps - Supine shoulder external rotation- KNEES BENT  - 1 x daily - 7 x weekly - 2 sets - 10 reps - Alternating star pattern  - 1 x daily - 7 x weekly - 2 sets - 10 reps - Standing Shoulder Scaption  - 1 x daily - 7 x weekly - 2 sets - 12 reps - Lower Trap Wall slides  - 1 x daily - 7 x weekly - 2 sets - 10 reps - Standing Sleeper Stretch at Wall  - 1 x daily - 7 x weekly - 2 sets - 2 reps - 30 seconds hold    ASSESSMENT:   CLINICAL IMPRESSION: Pt arrives w/ 4/10 pain he attributes to going to gym yesterday, no issues after last session. Today focusing on progression of GH/periscapular endurance exercises, also worked on Geologist, engineeringmechanics with lat pull down as he notes that it tends to bother him at gym. Improved comfort with alteration of trunk lean as he demos tendency for increased extension at thoracic spine relative to lumbar spine. Continues with concordant trigger points in rhomboids/middle traps, improves with manual as above but does remain persistent. Reports 3-3.5/10 pain on NPS on departure, interested in more dry needling going forward. Pt departs today's session in no acute distress, all voiced questions/concerns addressed appropriately from PT perspective.     OBJECTIVE IMPAIRMENTS: decreased activity tolerance, decreased endurance, decreased mobility, decreased ROM, decreased strength, hypomobility, impaired UE functional use, and pain.    ACTIVITY LIMITATIONS: carrying, sitting, sleeping, and reach over head   PARTICIPATION LIMITATIONS: meal prep, cleaning, and community  activity   PERSONAL FACTORS: Time since onset of injury/illness/exacerbation are also affecting patient's functional outcome.    REHAB POTENTIAL: Good   CLINICAL DECISION MAKING: Stable/uncomplicated   EVALUATION COMPLEXITY: Low     GOALS: Goals reviewed with patient? No   SHORT TERM GOALS: Target date: 06/29/2022   Pt will demonstrate appropriate understanding and performance of initially prescribed HEP in order to facilitate improved independence with management of symptoms.  Baseline: HEP provided on eval 07/03/22: I and compliant Goal status: MET    2. Pt will score greater than or equal to 61% on FOTO in order to demonstrate improved perception of function due to symptoms.            Baseline:  55%  07/03/22: deferred due to pain increase   07/20/22: 55%            Goal status: ONGOING     LONG TERM GOALS: Target date: 08/17/2022  (Updated 07/20/22) Pt will score 67% on FOTO in order to demonstrate improved perception of function due to symptoms. Baseline: 55% 07/20/22: 55% Goal status: ONGOING   2.  Pt will demonstrate at least 165 degrees of active L shoulder elevation in order to demonstrate improved tolerance to functional movement patterns such as reaching overhead.  Baseline: see ROM chart above 07/20/22: 161 deg flex, 160 abd Goal status: PROGRESSING   3.  Pt will demonstrate at least 4+/5 shoulder ER/IR MMT for improved symmetry of UE strength and improved tolerance to functional movements.  Baseline: see MMT chart above 07/20/22: 4+/5 rotational MMT but painful Goal status: NEARLY MET   4. Pt will report/demonstrate ability to perform overhead reach with less than 2 point increase in pain on NPS in order to indicative improved tolerance/independence with functional tasks such as cleaning/dressing.            Baseline: up to 8/10 with daily activities  07/20/22: continues to have increase in pain w/ overhead activities, up to 6/10             Goal status: PROGRESSING              5. Pt will report ability to sleep through the night with less than 2 interruptions due to L shoulder pain in order to facilitate improved overall health and QOL.                       Baseline: waking 2-3 times a night on average   07/20/22: pt states sleep has much improved since starting PT, wakes him up a couple times a week                      Goal status: MET     PLAN (updated 07/20/22):   PT FREQUENCY: 2x/week     PT DURATION: 4 weeks   PLANNED INTERVENTIONS: Therapeutic exercises, Therapeutic activity, Neuromuscular re-education, Balance training, Gait training, Patient/Family education, Self Care, Joint mobilization, Joint manipulation, Dry Needling, Electrical stimulation, Spinal manipulation, Spinal mobilization, Cryotherapy, Moist heat, Taping, Manual therapy, and Re-evaluation   PLAN FOR NEXT SESSION: review/update HEP PRN. Pt interested in more dry needling. Would likely benefit from gradual progression to more gym based program to facilitate increased independence post d/c   Ashley Murrain PT, DPT 07/31/2022 12:00 PM

## 2022-07-31 ENCOUNTER — Encounter: Payer: Self-pay | Admitting: Physical Therapy

## 2022-07-31 ENCOUNTER — Ambulatory Visit: Payer: Medicaid Other | Admitting: Physical Therapy

## 2022-07-31 DIAGNOSIS — M6281 Muscle weakness (generalized): Secondary | ICD-10-CM | POA: Diagnosis not present

## 2022-07-31 DIAGNOSIS — M25512 Pain in left shoulder: Secondary | ICD-10-CM | POA: Diagnosis not present

## 2022-07-31 DIAGNOSIS — G8929 Other chronic pain: Secondary | ICD-10-CM | POA: Diagnosis not present

## 2022-07-31 DIAGNOSIS — M25612 Stiffness of left shoulder, not elsewhere classified: Secondary | ICD-10-CM | POA: Diagnosis not present

## 2022-08-03 ENCOUNTER — Ambulatory Visit: Payer: Medicaid Other | Admitting: Physical Therapy

## 2022-08-03 ENCOUNTER — Encounter: Payer: Self-pay | Admitting: Physical Therapy

## 2022-08-03 DIAGNOSIS — M6281 Muscle weakness (generalized): Secondary | ICD-10-CM

## 2022-08-03 DIAGNOSIS — G8929 Other chronic pain: Secondary | ICD-10-CM

## 2022-08-03 DIAGNOSIS — M25512 Pain in left shoulder: Secondary | ICD-10-CM | POA: Diagnosis not present

## 2022-08-03 DIAGNOSIS — M25612 Stiffness of left shoulder, not elsewhere classified: Secondary | ICD-10-CM

## 2022-08-03 NOTE — Therapy (Signed)
OUTPATIENT PHYSICAL THERAPY TREATMENT NOTE   Patient Name: Jonathon MarlinJason L Harris MRN: [id number] DOB:[birth year], 47 y.o., male Today's Date: 08/03/2022  PCP: Anne NgNche, Charlotte Lum, NP   REFERRING PROVIDER: Anne NgNche, Charlotte Lum, NP  END OF SESSION:   PT End of Session - 08/03/22 1017     Visit Number 10    Number of Visits 17    Date for PT Re-Evaluation 09/14/22    Authorization Type [employer]    Authorization Time Period 06/08/22-08/07/22    Authorization - Visit Number 8    Authorization - Number of Visits 10    PT Start Time 1015    PT Stop Time 1102    PT Time Calculation (min) 47 min    Activity Tolerance Patient tolerated treatment well;No increased pain    [facility] tasks assessed/performed              Past Medical History:  Diagnosis Date   Elevated BP without diagnosis of hypertension 05/14/2016   Thyroid disease    hyperactive- no medications at this time   Past Surgical History:  Procedure Laterality Date   CYST EXCISION     chest   Patient Active Problem List   Diagnosis Date Noted   Pseudopolyposis of colon without complication, unspecified part of colon 05/08/2022   Radiculopathy 02/07/2021   Benign colon polyp 08/02/2020   Low TSH level 03/15/2020   Prediabetes 02/09/2020   Family hx of colon cancer 02/01/2020   History of tobacco use 02/01/2020   Hyperlipidemia 05/12/2016   Chronic bilateral back pain 02/10/2016    REFERRING DIAG: M25.512,G89.29 (ICD-10-CM) - Chronic left shoulder pain 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  THERAPY DIAG:  Chronic left shoulder pain  Stiffness of left shoulder, not elsewhere classified  Muscle weakness (generalized)  Rationale for Evaluation and Treatment Rehabilitation  PERTINENT HISTORY: Thyroid, hx tobacco  PRECAUTIONS: none  SUBJECTIVE:                                                                                                                                                                                       SUBJECTIVE STATEMENT:   "Doing pretty good,   PAIN:  Are you having pain: yes,  3/10 Location/description: L shoulder, dull aching, pulling, deep Best-worst over past week: 0-6/10 ( on eval 2-6/10 ) Per eval -  - aggravating factors: sleeping on L, reaching overhead or to side, lifting heavy items, sitting for prolonged periods - Easing factors: movement, stretching, medication   OBJECTIVE: (objective measures completed at initial evaluation unless otherwise dated)   DIAGNOSTIC FINDINGS:  L GH XR unremarkable for acute abnormalities per chart  review   PATIENT SURVEYS:  FOTO: 55% current, 67% predicted  07/20/22 FOTO 55% 08/03/2022  57%   COGNITION: Overall cognitive status: Within functional limits for tasks assessed                                  SENSATION: WFL B UE   POSTURE: Grossly WNL, mild FHP   UPPER EXTREMITY ROM:   A/PROM Right eval Left eval Left 06/26/22 Left 06/2522 AROM  Shoulder flexion 178 deg 150 deg * 160 Pn  161 deg mild pain at end range  Shoulder abduction 175 deg 158 deg * 160 160 deg p!  Shoulder internal rotation        Shoulder external rotation        Elbow flexion        Elbow extension        Wrist flexion        Wrist extension         (Blank rows = not tested) (Key: WFL = within functional limits not formally assessed, * = concordant pain, s = stiffness/stretching sensation, NT = not tested)  Comments:    UPPER EXTREMITY MMT:   MMT Right eval Left eval R/L  07/20/22  Shoulder flexion 5 4 * 5/4+ p!  Shoulder extension       Shoulder abduction 5 4 *  5/4+ p!  Shoulder extension       Shoulder internal rotation 5 4 (elbow) 5/4+ p!  Shoulder external rotation 5 4 - * 5/4+ p!  Elbow flexion   5   Elbow extension   4+ (posterior shoulder pain)   Grip strength       (Blank rows = not tested)  (Key: WFL = within functional limits not formally assessed, * = concordant pain, s =  stiffness/stretching sensation, NT = not tested)  Comments:      PALPATION:  Concordant tenderness L infraspinatus/teres, mild tenderness L LS, lat, deltoid, bicipital groove             TODAY'S TREATMENT:                [facility] Adult PT Treatment:                                                DATE: 08/03/22 Therapeutic Exercise: UBE L5 x 4 min  (FWD/ BWD x 2 in each) Lat pulldown 2 x 10 30# Sleeper stretch 1 x 30 sec Quadruped I's T's and Y's 1 x 10 with 2#, contralateral side scapular protraction Manual Therapy: IASTM over infraspinatus and teres minor  Trigger Point Dry-Needling  Treatment instructions: Expect mild to moderate muscle soreness. S/S of pneumothorax if dry needled over a lung field, and to seek immediate medical attention should they occur. Patient verbalized understanding of these instructions and education.  Patient Consent Given: Yes Education handout provided: Previously provided Muscles treated: Infraspinatus and teres minor on the L Electrical stimulation performed: Yes Parameters:  CPS L20, set to Ma, adjusting to tolerance intermittently x 10 min  Treatment response/outcome: twitch response, patient tolerated treatment well.      [facility] Adult PT Treatment:  DATE: 07/31/22 Therapeutic Exercise: Swiss ball up wall 2x12 cues for setup and extra lean for ROM/T spine ext Swiss ball perturbation 2x45sec at eye level, cues for form/setup High>low row 13# unilat 2x8 cues for form  RTB closed chain reaches at wall, 3 way, 2x5 laps BIL cues for form and setup Lat pull down 30# 2x to fatigue cues for trunk angle which improves tolerance  Manual Therapy: Sidelying (L side up) STM and trigger point release L rhomboid, mid trap, lower trap, UT, LS. Active/passive pin and stretch trigger point rhomboid/middle trap    [facility] Adult PT Treatment:                                                DATE: 07/29/22 Therapeutic  Exercise: Swiss ball flexion up wall 2x12 cues for comfortable ROM  Swiss ball perturbation 3x30sec at eye level cues for setup RTB around wrist BIL scaption 2x8 cues for posture and pacing  High>low row CC 7# unilat, 2x10 cues for setup HEP update as above, provided BTB for self progression PRN  Manual Therapy: Sidelying (L side up) STM and trigger point release L rhomboid, mid trap, lower trap, UT, LS    PATIENT EDUCATION: Education details: rationale for interventions, HEP update Person educated: Patient Education method: Explanation, Demonstration, Tactile cues, Verbal cues, and Handouts Education comprehension: verbalized understanding, returned demonstration, verbal cues required, tactile cues required, and needs further education     HOME EXERCISE PROGRAM: Access Code: [email or web address] URL: [email or web address] Date: 07/29/2022 Prepared by: Fransisco Hertz  Program Notes - add red band around wrist for scaption exercise- provided blue band on 07/29/22 to progress resistance   Exercises - Standing Isometric Shoulder External Rotation with Doorway  - 1 x daily - 7 x weekly - 3 sets - 5 reps - Supine Shoulder Horizontal Abduction with Resistance  - 1 x daily - 7 x weekly - 2 sets - 10 reps - Supine shoulder external rotation- KNEES BENT  - 1 x daily - 7 x weekly - 2 sets - 10 reps - Alternating star pattern  - 1 x daily - 7 x weekly - 2 sets - 10 reps - Standing Shoulder Scaption  - 1 x daily - 7 x weekly - 2 sets - 12 reps - Lower Trap Wall slides  - 1 x daily - 7 x weekly - 2 sets - 10 reps - Standing Sleeper Stretch at Wall  - 1 x daily - 7 x weekly - 2 sets - 2 reps - 30 seconds hold    ASSESSMENT:   CLINICAL IMPRESSION: Mr Sponaugle is making good progress with physical therapy reporting improvement in strength/ endurance. He increased his FOTO score to 57% today, and is making appropriate progress toward his goals. Continued working on TPDN focusing on L  infraspinatus/ teres minor combined with E-stim followed with IASTM techniques. Continued working on posterior chain activation which he did well with but does fatigue with LUE compared to the [place]. End of session he reported feeling good and denied any increase in pain throughout session.    OBJECTIVE IMPAIRMENTS: decreased activity tolerance, decreased endurance, decreased mobility, decreased ROM, decreased strength, hypomobility, impaired UE functional use, and pain.    ACTIVITY LIMITATIONS: carrying, sitting, sleeping, and reach over head   PARTICIPATION LIMITATIONS: meal prep, cleaning, and community activity   PERSONAL FACTORS: Time  since onset of injury/illness/exacerbation are also affecting patient's functional outcome.    REHAB POTENTIAL: Good   CLINICAL DECISION MAKING: Stable/uncomplicated   EVALUATION COMPLEXITY: Low     GOALS: Goals reviewed with patient? No   SHORT TERM GOALS: Target date: 06/29/2022   Pt will demonstrate appropriate understanding and performance of initially prescribed HEP in order to facilitate improved independence with management of symptoms.  Baseline: HEP provided on eval 07/03/22: I and compliant Goal status: MET    2. Pt will score greater than or equal to 61% on FOTO in order to demonstrate improved perception of function due to symptoms.            Baseline: 55%  07/03/22: deferred due to pain increase   07/20/22: 55%            Goal status: ONGOING 08/03/2022   LONG TERM GOALS: Target date: 08/17/2022  (Updated 07/20/22) Pt will score 67% on FOTO in order to demonstrate improved perception of function due to symptoms. Baseline: 55% 07/20/22: 55% Goal status: ONGOING 08/03/2022   2.  Pt will demonstrate at least 165 degrees of active L shoulder elevation in order to demonstrate improved tolerance to functional movement patterns such as reaching overhead.  Baseline: see ROM chart above 07/20/22: 161 deg flex, 160 abd Goal status: PROGRESSING  07/20/22   3.  Pt will demonstrate at least 4+/5 shoulder ER/IR MMT for improved symmetry of UE strength and improved tolerance to functional movements.  Baseline: see MMT chart above 07/20/22: 4+/5 rotational MMT but painful Goal status: NEARLY MET 07/20/22   4. Pt will report/demonstrate ability to perform overhead reach with less than 2 point increase in pain on NPS in order to indicative improved tolerance/independence with functional tasks such as cleaning/dressing.            Baseline: up to 8/10 with daily activities  07/20/22: continues to have increase in pain w/ overhead activities, up to 6/10             Goal status: PROGRESSING 08/03/2022             5. Pt will report ability to sleep through the night with less than 2 interruptions due to L shoulder pain in order to facilitate improved overall health and QOL.                       Baseline: waking 2-3 times a night on average 07/20/22: pt states sleep has much improved since starting PT, wakes him up a couple times a week                      Goal status: MET     PLAN (updated 07/20/22):   PT FREQUENCY: 2x/week     PT DURATION: 4 weeks   PLANNED INTERVENTIONS: Therapeutic exercises, Therapeutic activity, Neuromuscular re-education, Balance training, Gait training, Patient/Family education, Self Care, Joint mobilization, Joint manipulation, Dry Needling, Electrical stimulation, Spinal manipulation, Spinal mobilization, Cryotherapy, Moist heat, Taping, Manual therapy, and Re-evaluation   PLAN FOR NEXT SESSION: review/update HEP PRN. Pt interested in more dry needling. Would likely benefit from gradual progression to more gym based program to facilitate increased independence post d/c. Potentially need to resubmit for more visits through [facility]. Response to I's T's and Y's add to HEP.   Kinnie Kaupp PT, DPT, LAT, ATC  08/03/22  11:26 AM

## 2022-08-04 NOTE — Therapy (Signed)
OUTPATIENT PHYSICAL THERAPY TREATMENT NOTE   Patient Name: Jonathon MarlinJason L Rosko MRN: [id number] DOB:July 08, 1975, 47 y.o., male Today's Date: 08/04/2022  PCP: Anne NgNche, Charlotte Lum, NP   REFERRING PROVIDER: Anne NgNche, Charlotte Lum, NP  END OF SESSION:      Past Medical History:  Diagnosis Date   Elevated BP without diagnosis of hypertension 05/14/2016   Thyroid disease    hyperactive- no medications at this time   Past Surgical History:  Procedure Laterality Date   CYST EXCISION     chest   Patient Active Problem List   Diagnosis Date Noted   Pseudopolyposis of colon without complication, unspecified part of colon 05/08/2022   Radiculopathy 02/07/2021   Benign colon polyp 08/02/2020   Low TSH level 03/15/2020   Prediabetes 02/09/2020   Family hx of colon cancer 02/01/2020   History of tobacco use 02/01/2020   Hyperlipidemia 05/12/2016   Chronic bilateral back pain 02/10/2016    REFERRING DIAG: M25.512,G89.29 (ICD-10-CM) - Chronic left shoulder pain 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  Rationale for Evaluation and Treatment Rehabilitation  PERTINENT HISTORY: Thyroid, hx tobacco  PRECAUTIONS: none  SUBJECTIVE:                                                                                                                                                                                      SUBJECTIVE STATEMENT:   ***   PAIN:  Are you having pain: yes,  *** 3/10 Location/description: L shoulder, dull aching, pulling, deep Best-worst over past week: 0-6/10 ( on eval 2-6/10 ) Per eval -  - aggravating factors: sleeping on L, reaching overhead or to side, lifting heavy items, sitting for prolonged periods - Easing factors: movement, stretching, medication   OBJECTIVE: (objective measures completed at initial evaluation unless otherwise dated)   DIAGNOSTIC FINDINGS:  L GH XR unremarkable for acute abnormalities per chart  review   PATIENT SURVEYS:  FOTO: 55% current, 67% predicted  07/20/22 FOTO 55% 08/03/2022  57%   COGNITION: Overall cognitive status: Within functional limits for tasks assessed                                  SENSATION: WFL B UE   POSTURE: Grossly WNL, mild FHP   UPPER EXTREMITY ROM:   A/PROM Right eval Left eval Left 06/26/22 Left 06/2522 AROM  Shoulder flexion 178 deg 150 deg * 160 Pn  161 deg mild pain at end range  Shoulder abduction 175 deg 158 deg * 160 160 deg p!  Shoulder internal rotation  Shoulder external rotation        Elbow flexion        Elbow extension        Wrist flexion        Wrist extension         (Blank rows = not tested) (Key: WFL = within functional limits not formally assessed, * = concordant pain, s = stiffness/stretching sensation, NT = not tested)  Comments:    UPPER EXTREMITY MMT:   MMT Right eval Left eval R/L  07/20/22  Shoulder flexion 5 4 * 5/4+ p!  Shoulder extension       Shoulder abduction 5 4 *  5/4+ p!  Shoulder extension       Shoulder internal rotation 5 4 (elbow) 5/4+ p!  Shoulder external rotation 5 4 - * 5/4+ p!  Elbow flexion   5   Elbow extension   4+ (posterior shoulder pain)   Grip strength       (Blank rows = not tested)  (Key: WFL = within functional limits not formally assessed, * = concordant pain, s = stiffness/stretching sensation, NT = not tested)  Comments:      PALPATION:  Concordant tenderness L infraspinatus/teres, mild tenderness L LS, lat, deltoid, bicipital groove             TODAY'S TREATMENT:                [facility] Adult PT Treatment:                                                DATE: 08/05/22 Therapeutic Exercise: *** Manual Therapy: *** Neuromuscular re-ed: *** Therapeutic Activity: *** Modalities: *** Self Care: ***    [facility] Adult PT Treatment:                                                DATE: 08/03/22 Therapeutic Exercise: UBE L5 x 4 min  (FWD/ BWD x 2 in each) Lat pulldown 2  x 10 30# Sleeper stretch 1 x 30 sec Quadruped I's T's and Y's 1 x 10 with 2#, contralateral side scapular protraction Manual Therapy: IASTM over infraspinatus and teres minor  Trigger Point Dry-Needling  Treatment instructions: Expect mild to moderate muscle soreness. S/S of pneumothorax if dry needled over a lung field, and to seek immediate medical attention should they occur. Patient verbalized understanding of these instructions and education.  Patient Consent Given: Yes Education handout provided: Previously provided Muscles treated: Infraspinatus and teres minor on the L Electrical stimulation performed: Yes Parameters:  CPS L20, set to Ma, adjusting to tolerance intermittently x 10 min  Treatment response/outcome: twitch response, patient tolerated treatment well.      [facility] Adult PT Treatment:                                                DATE: 07/31/22 Therapeutic Exercise: Swiss ball up wall 2x12 cues for setup and extra lean for ROM/T spine ext Swiss ball perturbation 2x45sec at eye level, cues for form/setup High>low row 13# unilat 2x8 cues for form  RTB closed chain reaches at wall, 3 way, 2x5 laps BIL cues for form and setup Lat pull down 30# 2x to fatigue cues for trunk angle which improves tolerance  Manual Therapy: Sidelying (L side up) STM and trigger point release L rhomboid, mid trap, lower trap, UT, LS. Active/passive pin and stretch trigger point rhomboid/middle trap    [facility] Adult PT Treatment:                                                DATE: 07/29/22 Therapeutic Exercise: Swiss ball flexion up wall 2x12 cues for comfortable ROM  Swiss ball perturbation 3x30sec at eye level cues for setup RTB around wrist BIL scaption 2x8 cues for posture and pacing  High>low row CC 7# unilat, 2x10 cues for setup HEP update as above, provided BTB for self progression PRN  Manual Therapy: Sidelying (L side up) STM and trigger point release L rhomboid, mid trap, lower  trap, UT, LS    PATIENT EDUCATION: Education details: rationale for interventions, HEP update Person educated: Patient Education method: Explanation, Demonstration, Tactile cues, Verbal cues, and Handouts Education comprehension: verbalized understanding, returned demonstration, verbal cues required, tactile cues required, and needs further education     HOME EXERCISE PROGRAM: Access Code: [email or web address] URL: [email or web address] Date: 07/29/2022 Prepared by: Fransisco Hertz  Program Notes - add red band around wrist for scaption exercise- provided blue band on 07/29/22 to progress resistance   Exercises - Standing Isometric Shoulder External Rotation with Doorway  - 1 x daily - 7 x weekly - 3 sets - 5 reps - Supine Shoulder Horizontal Abduction with Resistance  - 1 x daily - 7 x weekly - 2 sets - 10 reps - Supine shoulder external rotation- KNEES BENT  - 1 x daily - 7 x weekly - 2 sets - 10 reps - Alternating star pattern  - 1 x daily - 7 x weekly - 2 sets - 10 reps - Standing Shoulder Scaption  - 1 x daily - 7 x weekly - 2 sets - 12 reps - Lower Trap Wall slides  - 1 x daily - 7 x weekly - 2 sets - 10 reps - Standing Sleeper Stretch at Wall  - 1 x daily - 7 x weekly - 2 sets - 2 reps - 30 seconds hold    ASSESSMENT:   CLINICAL IMPRESSION: ***  *** Mr Kosmatka is making good progress with physical therapy reporting improvement in strength/ endurance. He increased his FOTO score to 57% today, and is making appropriate progress toward his goals. Continued working on TPDN focusing on L infraspinatus/ teres minor combined with E-stim followed with IASTM techniques. Continued working on posterior chain activation which he did well with but does fatigue with LUE compared to the [place]. End of session he reported feeling good and denied any increase in pain throughout session.    OBJECTIVE IMPAIRMENTS: decreased activity tolerance, decreased endurance, decreased mobility, decreased ROM,  decreased strength, hypomobility, impaired UE functional use, and pain.    ACTIVITY LIMITATIONS: carrying, sitting, sleeping, and reach over head   PARTICIPATION LIMITATIONS: meal prep, cleaning, and community activity   PERSONAL FACTORS: Time since onset of injury/illness/exacerbation are also affecting patient's functional outcome.    REHAB POTENTIAL: Good   CLINICAL DECISION MAKING: Stable/uncomplicated   EVALUATION COMPLEXITY: Low     GOALS: Goals reviewed with  patient? No   SHORT TERM GOALS: Target date: 06/29/2022   Pt will demonstrate appropriate understanding and performance of initially prescribed HEP in order to facilitate improved independence with management of symptoms.  Baseline: HEP provided on eval 07/03/22: I and compliant Goal status: MET    2. Pt will score greater than or equal to 61% on FOTO in order to demonstrate improved perception of function due to symptoms.            Baseline: 55%  07/03/22: deferred due to pain increase   07/20/22: 55%            Goal status: ONGOING 08/03/2022   LONG TERM GOALS: Target date: 08/17/2022  (Updated 07/20/22) Pt will score 67% on FOTO in order to demonstrate improved perception of function due to symptoms. Baseline: 55% 07/20/22: 55% Goal status: ONGOING 08/03/2022   2.  Pt will demonstrate at least 165 degrees of active L shoulder elevation in order to demonstrate improved tolerance to functional movement patterns such as reaching overhead.  Baseline: see ROM chart above 07/20/22: 161 deg flex, 160 abd Goal status: PROGRESSING 07/20/22   3.  Pt will demonstrate at least 4+/5 shoulder ER/IR MMT for improved symmetry of UE strength and improved tolerance to functional movements.  Baseline: see MMT chart above 07/20/22: 4+/5 rotational MMT but painful Goal status: NEARLY MET 07/20/22   4. Pt will report/demonstrate ability to perform overhead reach with less than 2 point increase in pain on NPS in order to indicative improved  tolerance/independence with functional tasks such as cleaning/dressing.            Baseline: up to 8/10 with daily activities  07/20/22: continues to have increase in pain w/ overhead activities, up to 6/10             Goal status: PROGRESSING 08/03/2022             5. Pt will report ability to sleep through the night with less than 2 interruptions due to L shoulder pain in order to facilitate improved overall health and QOL.                       Baseline: waking 2-3 times a night on average 07/20/22: pt states sleep has much improved since starting PT, wakes him up a couple times a week                      Goal status: MET     PLAN (updated 07/20/22):   PT FREQUENCY: 2x/week     PT DURATION: 4 weeks   PLANNED INTERVENTIONS: Therapeutic exercises, Therapeutic activity, Neuromuscular re-education, Balance training, Gait training, Patient/Family education, Self Care, Joint mobilization, Joint manipulation, Dry Needling, Electrical stimulation, Spinal manipulation, Spinal mobilization, Cryotherapy, Moist heat, Taping, Manual therapy, and Re-evaluation   PLAN FOR NEXT SESSION: review/update HEP PRN. Pt interested in more dry needling. Would likely benefit from gradual progression to more gym based program to facilitate increased independence post d/c. Potentially need to resubmit for more visits through [facility]. Response to I's T's and Y's add to HEP. ***    Ashley Murrain PT, DPT 08/04/2022 8:33 AM

## 2022-08-05 ENCOUNTER — Ambulatory Visit: Payer: Medicaid Other | Admitting: Physical Therapy

## 2022-08-05 ENCOUNTER — Encounter: Payer: Self-pay | Admitting: Physical Therapy

## 2022-08-05 DIAGNOSIS — M25612 Stiffness of left shoulder, not elsewhere classified: Secondary | ICD-10-CM

## 2022-08-05 DIAGNOSIS — G8929 Other chronic pain: Secondary | ICD-10-CM

## 2022-08-05 DIAGNOSIS — M6281 Muscle weakness (generalized): Secondary | ICD-10-CM

## 2022-08-05 DIAGNOSIS — M25512 Pain in left shoulder: Secondary | ICD-10-CM | POA: Diagnosis not present

## 2022-08-11 NOTE — Therapy (Signed)
OUTPATIENT PHYSICAL THERAPY TREATMENT NOTE + RECERTIFICATION   Patient Name: Jonathon Harris MRN: [id number] DOB:Feb 20, 1976, 47 y.o., male Today's Date: 08/11/2022  PCP: Anne Ng, NP   REFERRING PROVIDER: Anne Ng, NP  END OF SESSION:       Past Medical History:  Diagnosis Date   Elevated BP without diagnosis of hypertension 05/14/2016   Thyroid disease    hyperactive- no medications at this time   Past Surgical History:  Procedure Laterality Date   CYST EXCISION     chest   Patient Active Problem List   Diagnosis Date Noted   Pseudopolyposis of colon without complication, unspecified part of colon 05/08/2022   Radiculopathy 02/07/2021   Benign colon polyp 08/02/2020   Low TSH level 03/15/2020   Prediabetes 02/09/2020   Family hx of colon cancer 02/01/2020   History of tobacco use 02/01/2020   Hyperlipidemia 05/12/2016   Chronic bilateral back pain 02/10/2016    REFERRING DIAG: M25.512,G89.29 (ICD-10-CM) - Chronic left shoulder pain 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  Rationale for Evaluation and Treatment Rehabilitation  PERTINENT HISTORY: Thyroid, hx tobacco  PRECAUTIONS: none  SUBJECTIVE:                                                                                                                                                                                      SUBJECTIVE STATEMENT:   ***  *** Pt states needling last session was helpful, feels like he is progressing well with PT. Felt a little soreness yesterday but nothing significant.    PAIN:  Are you having pain: yes,  2/10 Location/description: L shoulder, dull aching, pulling, deep Best-worst over past week: 0-6/10 ( on eval 2-6/10 ) Per eval -  - aggravating factors: sleeping on L, reaching overhead or to side, lifting heavy items, sitting for prolonged periods - Easing factors: movement, stretching,  medication   OBJECTIVE: (objective measures completed at initial evaluation unless otherwise dated)   DIAGNOSTIC FINDINGS:  L GH XR unremarkable for acute abnormalities per chart review   PATIENT SURVEYS:  FOTO: 55% current, 67% predicted  07/20/22 FOTO 55% 08/03/2022  57%   COGNITION: Overall cognitive status: Within functional limits for tasks assessed                                  SENSATION: WFL B UE   POSTURE: Grossly WNL, mild FHP   UPPER EXTREMITY ROM:   A/PROM Right eval Left eval Left 06/26/22 Left 06/2522 AROM  Shoulder flexion 178 deg 150 deg * 160  Pn  161 deg mild pain at end range  Shoulder abduction 175 deg 158 deg * 160 160 deg p!  Shoulder internal rotation        Shoulder external rotation        Elbow flexion        Elbow extension        Wrist flexion        Wrist extension         (Blank rows = not tested) (Key: WFL = within functional limits not formally assessed, * = concordant pain, s = stiffness/stretching sensation, NT = not tested)  Comments:    UPPER EXTREMITY MMT:   MMT Right eval Left eval R/L  07/20/22  Shoulder flexion 5 4 * 5/4+ p!  Shoulder extension       Shoulder abduction 5 4 *  5/4+ p!  Shoulder extension       Shoulder internal rotation 5 4 (elbow) 5/4+ p!  Shoulder external rotation 5 4 - * 5/4+ p!  Elbow flexion   5   Elbow extension   4+ (posterior shoulder pain)   Grip strength       (Blank rows = not tested)  (Key: WFL = within functional limits not formally assessed, * = concordant pain, s = stiffness/stretching sensation, NT = not tested)  Comments:      PALPATION:  Concordant tenderness L infraspinatus/teres, mild tenderness L LS, lat, deltoid, bicipital groove             TODAY'S TREATMENT:                [facility] Adult PT Treatment:                                                DATE: 08/12/22 Therapeutic Exercise: *** Manual Therapy: *** Neuromuscular re-ed: *** Therapeutic  Activity: *** Modalities: *** Self Care: ***    [facility] Adult PT Treatment:                                                DATE: 08/05/22 Therapeutic Exercise: UBE total (2.5 fwd/back) Quadruped 3 way reach w RTB 2x5 cues for sequencing  Prone LUE ER with UE at ~90 deg abd, x12 BW cues for reduced compensations; x8 w 2# CC high>low row 13# LUE x12, 17#  Cable tricep pulldown 5# 2x12 cues for form and control  Manual Therapy: Sidelying; STM L rhomboid, infraspinatus, middle trap, post delt. Trigger point release rhomboid/mid trap + active pin and stretch    [facility] Adult PT Treatment:                                                DATE: 08/03/22 Therapeutic Exercise: UBE L5 x 4 min  (FWD/ BWD x 2 in each) Lat pulldown 2 x 10 30# Sleeper stretch 1 x 30 sec Quadruped I's T's and Y's 1 x 10 with 2#, contralateral side scapular protraction Manual Therapy: IASTM over infraspinatus and teres minor  Trigger Point Dry-Needling  Treatment instructions: Expect mild to moderate muscle soreness. S/S of  pneumothorax if dry needled over a lung field, and to seek immediate medical attention should they occur. Patient verbalized understanding of these instructions and education.  Patient Consent Given: Yes Education handout provided: Previously provided Muscles treated: Infraspinatus and teres minor on the L Electrical stimulation performed: Yes Parameters:  CPS L20, set to Ma, adjusting to tolerance intermittently x 10 min  Treatment response/outcome: twitch response, patient tolerated treatment well.       PATIENT EDUCATION: Education details: rationale for interventions Person educated: Patient Education method: Explanation, Demonstration, Tactile cues, Verbal cues, and Handouts Education comprehension: verbalized understanding, returned demonstration, verbal cues required, tactile cues required, and needs further education     HOME EXERCISE PROGRAM: Access Code: [email or web address] URL:  [email or web address] Date: 07/29/2022 Prepared by: Fransisco Hertz  Program Notes - add red band around wrist for scaption exercise- provided blue band on 07/29/22 to progress resistance   Exercises - Standing Isometric Shoulder External Rotation with Doorway  - 1 x daily - 7 x weekly - 3 sets - 5 reps - Supine Shoulder Horizontal Abduction with Resistance  - 1 x daily - 7 x weekly - 2 sets - 10 reps - Supine shoulder external rotation- KNEES BENT  - 1 x daily - 7 x weekly - 2 sets - 10 reps - Alternating star pattern  - 1 x daily - 7 x weekly - 2 sets - 10 reps - Standing Shoulder Scaption  - 1 x daily - 7 x weekly - 2 sets - 12 reps - Lower Trap Wall slides  - 1 x daily - 7 x weekly - 2 sets - 10 reps - Standing Sleeper Stretch at [employer]  - 1 x daily - 7 x weekly - 2 sets - 2 reps - 30 seconds hold    ASSESSMENT:   CLINICAL IMPRESSION: ***   *** Pt arrives w/ 2/10 pain, endorses good response to needling last session and continued progress w/ PT, continues to have most of his pain at the end of the day and with prolonged driving (>1OX). Continues to tolerate progressions for GH/periscapular endurance/stability well with primary report of fatigue. Cues as above. Describes some mild increase in pain with fatigue (up to 2.5/10). Reports improved symptoms after manual as above, continues with trigger points in rhomboid/middle trap although pt does note they feel less irritable after needling last session. No adverse events. Pt departs today's session in no acute distress, all voiced questions/concerns addressed appropriately from PT perspective.      OBJECTIVE IMPAIRMENTS: decreased activity tolerance, decreased endurance, decreased mobility, decreased ROM, decreased strength, hypomobility, impaired UE functional use, and pain.    ACTIVITY LIMITATIONS: carrying, sitting, sleeping, and reach over head   PARTICIPATION LIMITATIONS: meal prep, cleaning, and community activity    PERSONAL FACTORS: Time since onset of injury/illness/exacerbation are also affecting patient's functional outcome.    REHAB POTENTIAL: Good   CLINICAL DECISION MAKING: Stable/uncomplicated   EVALUATION COMPLEXITY: Low     GOALS: Goals reviewed with patient? No   SHORT TERM GOALS: Target date: 06/29/2022   Pt will demonstrate appropriate understanding and performance of initially prescribed HEP in order to facilitate improved independence with management of symptoms.  Baseline: HEP provided on eval 07/03/22: I and compliant Goal status: MET    2. Pt will score greater than or equal to 61% on FOTO in order to demonstrate improved perception of function due to symptoms.            Baseline: 55%  07/03/22: deferred  due to pain increase   07/20/22: 55%            Goal status: ONGOING 08/03/2022   LONG TERM GOALS: Target date: 08/17/2022  (Updated 07/20/22) Pt will score 67% on FOTO in order to demonstrate improved perception of function due to symptoms. Baseline: 55% 07/20/22: 55% Goal status: ONGOING 08/03/2022   2.  Pt will demonstrate at least 165 degrees of active L shoulder elevation in order to demonstrate improved tolerance to functional movement patterns such as reaching overhead.  Baseline: see ROM chart above 07/20/22: 161 deg flex, 160 abd Goal status: PROGRESSING 07/20/22   3.  Pt will demonstrate at least 4+/5 shoulder ER/IR MMT for improved symmetry of UE strength and improved tolerance to functional movements.  Baseline: see MMT chart above 07/20/22: 4+/5 rotational MMT but painful Goal status: NEARLY MET 07/20/22   4. Pt will report/demonstrate ability to perform overhead reach with less than 2 point increase in pain on NPS in order to indicative improved tolerance/independence with functional tasks such as cleaning/dressing.            Baseline: up to 8/10 with daily activities  07/20/22: continues to have increase in pain w/ overhead activities, up to 6/10             Goal  status: PROGRESSING 08/03/2022             5. Pt will report ability to sleep through the night with less than 2 interruptions due to L shoulder pain in order to facilitate improved overall health and QOL.                       Baseline: waking 2-3 times a night on average 07/20/22: pt states sleep has much improved since starting PT, wakes him up a couple times a week                      Goal status: MET     PLAN (updated 07/20/22):   PT FREQUENCY: 2x/week     PT DURATION: 4 weeks   PLANNED INTERVENTIONS: Therapeutic exercises, Therapeutic activity, Neuromuscular re-education, Balance training, Gait training, Patient/Family education, Self Care, Joint mobilization, Joint manipulation, Dry Needling, Electrical stimulation, Spinal manipulation, Spinal mobilization, Cryotherapy, Moist heat, Taping, Manual therapy, and Re-evaluation   PLAN FOR NEXT SESSION: review/update HEP PRN. Pt interested in more dry needling. Would likely benefit from gradual progression to more gym based program to facilitate increased independence post d/c. Potentially need to resubmit for more visits through [facility]. ***    Ashley Murrain PT, DPT 08/11/2022 8:47 AM

## 2022-08-12 ENCOUNTER — Encounter: Payer: Self-pay | Admitting: Physical Therapy

## 2022-08-12 ENCOUNTER — Ambulatory Visit: Payer: Medicaid Other | Admitting: Physical Therapy

## 2022-08-12 DIAGNOSIS — M6281 Muscle weakness (generalized): Secondary | ICD-10-CM | POA: Diagnosis not present

## 2022-08-12 DIAGNOSIS — G8929 Other chronic pain: Secondary | ICD-10-CM

## 2022-08-12 DIAGNOSIS — M25512 Pain in left shoulder: Secondary | ICD-10-CM | POA: Diagnosis not present

## 2022-08-12 DIAGNOSIS — M25612 Stiffness of left shoulder, not elsewhere classified: Secondary | ICD-10-CM | POA: Diagnosis not present

## 2022-08-14 ENCOUNTER — Ambulatory Visit: Payer: Medicaid Other | Admitting: Physical Therapy

## 2022-08-17 ENCOUNTER — Ambulatory Visit: Payer: Medicaid Other | Admitting: Physical Therapy

## 2022-08-17 NOTE — Therapy (Signed)
OUTPATIENT PHYSICAL THERAPY TREATMENT NOTE    Patient Name: IZZAK Harris MRN: [id number] DOB:Jul 25, 1975, 47 y.o., male Today's Date: 08/18/2022  PCP: Jonathon Ng, NP   REFERRING PROVIDER: Anne Ng, NP  END OF SESSION:   PT End of Session - 08/18/22 1332     Visit Number 13    Number of Visits 17    Date for PT Re-Evaluation 09/14/22    Authorization Type [employer]    Authorization Time Period 6 visits 08/13/22-09/26/22    Authorization - Visit Number 1    Authorization - Number of Visits 6    PT Start Time 1333    PT Stop Time 1414    PT Time Calculation (min) 41 min    Activity Tolerance Patient tolerated treatment well    Behavior During Therapy WFL for tasks assessed/performed                 Past Medical History:  Diagnosis Date   Elevated BP without diagnosis of hypertension 05/14/2016   Thyroid disease    hyperactive- no medications at this time   Past Surgical History:  Procedure Laterality Date   CYST EXCISION     chest   Patient Active Problem List   Diagnosis Date Noted   Pseudopolyposis of colon without complication, unspecified part of colon 05/08/2022   Radiculopathy 02/07/2021   Benign colon polyp 08/02/2020   Low TSH level 03/15/2020   Prediabetes 02/09/2020   Family hx of colon cancer 02/01/2020   History of tobacco use 02/01/2020   Hyperlipidemia 05/12/2016   Chronic bilateral back pain 02/10/2016    REFERRING DIAG: M25.512,G89.29 (ICD-10-CM) - Chronic left shoulder pain 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 No issues after last session    PAIN:  Are you having pain: yes,  2/10 Location/description: L shoulder, dull aching, pulling, deep Best-worst over past week: 0-4/10 ( on eval 2-6/10 ) Per eval -  - aggravating factors: sleeping on L, reaching overhead or to side, lifting heavy items, sitting for prolonged periods - Easing factors: movement, stretching, medication   OBJECTIVE: (objective measures completed at initial evaluation unless otherwise  dated)   DIAGNOSTIC FINDINGS:  L GH XR unremarkable for acute abnormalities per chart review   PATIENT SURVEYS:  FOTO: 55% current, 67% predicted  07/20/22 FOTO 55% 08/03/2022  57%   COGNITION: Overall cognitive status: Within functional limits for tasks assessed                                  SENSATION: WFL B UE   POSTURE: Grossly WNL, mild FHP   UPPER EXTREMITY ROM:   A/PROM Right eval Left eval Left 06/26/22 Left 06/2522 AROM 08/12/22 AROM  Shoulder flexion 178 deg 150 deg * 160 Pn  161 deg mild pain at end range 165 deg painless  Shoulder abduction 175 deg 158 deg * 160 160 deg p! 166 deg mild pulling  Shoulder internal rotation         Shoulder external rotation         Elbow flexion         Elbow extension         Wrist flexion         Wrist extension          (Blank rows = not tested) (Key: WFL = within functional limits not formally assessed, * = concordant pain, s = stiffness/stretching sensation, NT = not tested)  Comments:    UPPER EXTREMITY MMT:   MMT Right eval Left eval R/L  07/20/22 R/L 08/12/22   Shoulder flexion 5 4 * 5/4+ p! 5/4+ (pulling, nonpainful)  Shoulder extension        Shoulder abduction 5 4 *  5/4+ p! 5/5 painless   Shoulder extension        Shoulder internal rotation 5 4 (elbow) 5/4+ p! 5/4+ mild elbow pain  Shoulder external rotation 5 4 - * 5/4+ p! 5/4+ mild pain  Elbow flexion   5     Elbow extension   4+ (posterior shoulder pain)    Grip strength        (Blank rows = not tested)  (Key: WFL = within functional limits not formally assessed, * = concordant pain, s = stiffness/stretching sensation, NT = not tested)  Comments:      PALPATION:  Concordant tenderness L infraspinatus/teres, mild tenderness L LS, lat, deltoid, bicipital groove             TODAY'S TREATMENT:                [facility] Adult PT Treatment:                                                DATE: 08/18/22 Therapeutic Exercise: UBE fwd/86min back  3# DB 3 way lat raise x5 each way, 5# 2x5 each way cues for form and appropriate ROM  Quadruped 3 way reach GTB 2x5 BIL UE  5# waiter's carry 3x45ft BIL UE cue for form and posture  5# shoulder shrugs 2x10 cues for eccentric emphasis and breath control  Education on relevant anatomy/physiology as it pertains to exercise in session, HEP, and general exercise outside of sessions (provided with GTB to progress program at home)  Manual Therapy: Sidelying STM to rhomboids/mid trap, infraspinatus. Active pin and stretch rhomboid trigger points, passive pin and stretch. Trigger point release rhomboids/mid trap Seated STM + trigger point release  levator scap, active pin and stretch scapular insertion of LS into cervical flexion 2x5   [facility] Adult PT Treatment:                                                DATE: 08/12/22 Therapeutic Exercise: Swiss ball up wall + end range push x12 cues for setup and range Swiss ball perturbations at 120 deg 2x30sec cues for setup Seated row machine 35# 2x12 cues for scapular mechanics Seated high row machine 20# 2x8  Education throughout on relevant anatomy/physiology as it pertains to exercises performed in clinic and with exercises pt performs at gym  Manual Therapy: Sidelying; STM L rhomboid/mid trap, low trap. Trigger point release rhomboid with active pin and stretch   Therapeutic Activity: MSK assessment +  education Discussion re: relevant activities, remaining deficits, and PT POC     [facility] Adult PT Treatment:                                                DATE: 08/05/22 Therapeutic Exercise: UBE total (2.5 fwd/back) Quadruped 3 way reach w RTB 2x5 cues for sequencing  Prone LUE ER with UE at ~90 deg abd, x12 BW cues for reduced compensations; x8 w 2# CC high>low row 13# LUE x12, 17#  Cable tricep pulldown 5# 2x12 cues for form and control  Manual Therapy: Sidelying; STM L rhomboid, infraspinatus, middle trap, post delt. Trigger point release rhomboid/mid trap + active pin and stretch    [facility] Adult PT Treatment:                                                DATE: 08/03/22 Therapeutic Exercise: UBE L5 x 4 min  (FWD/ BWD x 2 in each) Lat pulldown 2 x 10 30# Sleeper stretch 1 x 30 sec Quadruped I's T's and Y's 1 x 10 with 2#, contralateral side scapular protraction Manual Therapy: IASTM over infraspinatus and teres minor  Trigger Point Dry-Needling  Treatment instructions: Expect mild to moderate muscle soreness. S/S of pneumothorax if dry needled over a lung field, and to seek immediate medical attention should they occur. Patient verbalized understanding of these instructions and education.  Patient Consent Given: Yes Education handout provided: Previously provided Muscles treated: Infraspinatus and teres minor on the L Electrical stimulation performed: Yes Parameters:  CPS L20, set to Ma, adjusting to tolerance intermittently x 10 min  Treatment response/outcome: twitch response, patient tolerated treatment well.       PATIENT EDUCATION: Education details: rationale for interventions Person educated: Patient Education method: Explanation, Demonstration, Tactile cues, Verbal cues, and Handouts Education comprehension: verbalized understanding, returned demonstration, verbal cues required, tactile cues required, and needs further education     HOME EXERCISE  PROGRAM: Access Code: [email or web address] URL: [email or web address] Date: 07/29/2022 Prepared by: Fransisco Hertz  Program Notes - add red band around wrist for scaption exercise- provided blue band on 07/29/22 to progress resistance   Exercises - Standing Isometric Shoulder External Rotation with Doorway  - 1 x daily - 7 x weekly - 3 sets - 5 reps - Supine  Shoulder Horizontal Abduction with Resistance  - 1 x daily - 7 x weekly - 2 sets - 10 reps - Supine shoulder external rotation- KNEES BENT  - 1 x daily - 7 x weekly - 2 sets - 10 reps - Alternating star pattern  - 1 x daily - 7 x weekly - 2 sets - 10 reps - Standing Shoulder Scaption  - 1 x daily - 7 x weekly - 2 sets - 12 reps - Lower Trap Wall slides  - 1 x daily - 7 x weekly - 2 sets - 10 reps - Standing Sleeper Stretch at [employer]  - 1 x daily - 7 x weekly - 2 sets - 2 reps - 30 seconds hold    ASSESSMENT:   CLINICAL IMPRESSION: Pt arrives w/ 2/10 pain on NPS, no issues after last session. Today focusing on progression of GH/periscapular strength/endurance into more long-lever and overhead positions, which pt tolerates well, primary report of muscular fatigue. Manual at end of session as above with trigger point in rhomboid/mid trap, reports improvement manual as described above. No adverse events, departs with report of improved pain. Recommend continuing along current POC in order to address relevant deficits and improve functional tolerance. Pt departs today's session in no acute distress, all voiced questions/concerns addressed appropriately from PT perspective.      OBJECTIVE IMPAIRMENTS: decreased activity tolerance, decreased endurance, decreased mobility, decreased ROM, decreased strength, hypomobility, impaired UE functional use, and pain.    ACTIVITY LIMITATIONS: carrying, sitting, sleeping, and reach over head   PARTICIPATION LIMITATIONS: meal prep, cleaning, and community activity   PERSONAL FACTORS: Time since onset of  injury/illness/exacerbation are also affecting patient's functional outcome.    REHAB POTENTIAL: Good   CLINICAL DECISION MAKING: Stable/uncomplicated   EVALUATION COMPLEXITY: Low     GOALS: Goals reviewed with patient? No   SHORT TERM GOALS: Target date: 06/29/2022   Pt will demonstrate appropriate understanding and performance of initially prescribed HEP in order to facilitate improved independence with management of symptoms.  Baseline: HEP provided on eval 07/03/22: I and compliant Goal status: MET    2. Pt will score greater than or equal to 61% on FOTO in order to demonstrate improved perception of function due to symptoms.            Baseline: 55%  07/03/22: deferred due to pain increase   07/20/22: 55%            Goal status: ONGOING 08/03/2022   LONG TERM GOALS: Target date: 09/02/2022 (updated 08/12/22 to accommodate remaining visits in POC) Pt will score 67% on FOTO in order to demonstrate improved perception of function due to symptoms. Baseline: 55% 07/20/22: 55% Goal status: ONGOING 08/03/2022   2.  Pt will demonstrate at least 165 degrees of active L shoulder elevation in order to demonstrate improved tolerance to functional movement patterns such as reaching overhead.  Baseline: see ROM chart above 07/20/22: 161 deg flex, 160 abd 08/12/22: >/= 165 deg flex and abd Goal status: MET 08/12/22   3.  Pt will demonstrate at least 4+/5 shoulder ER/IR MMT for improved symmetry of UE strength and improved tolerance to functional movements.  Baseline: see MMT chart above 07/20/22: 4+/5 rotational MMT but painful 08/22/22: IR without shoulder pain, ER with mild pain Goal status: NEARLY MET 08/12/22   4. Pt will report/demonstrate ability to perform overhead reach with less than 2 point increase in pain on NPS in order to indicative improved tolerance/independence with functional tasks  such as cleaning/dressing.            Baseline: up to 8/10 with daily activities  07/20/22: continues to  have increase in pain w/ overhead activities, up to 6/10   08/12/22: up to 4/10 with daily activities            Goal status: PROGRESSING 08/12/2022             5. Pt will report ability to sleep through the night with less than 2 interruptions due to L shoulder pain in order to facilitate improved overall health and QOL.                       Baseline: waking 2-3 times a night on average 07/20/22: pt states sleep has much improved since starting PT, wakes him up a couple times a week                      Goal status: MET     PLAN (updated 08/12/22):   PT FREQUENCY: 2x/week     PT DURATION: 3 weeks   PLANNED INTERVENTIONS: Therapeutic exercises, Therapeutic activity, Neuromuscular re-education, Balance training, Gait training, Patient/Family education, Self Care, Joint mobilization, Joint manipulation, Dry Needling, Electrical stimulation, Spinal manipulation, Spinal mobilization, Cryotherapy, Moist heat, Taping, Manual therapy, and Re-evaluation   PLAN FOR NEXT SESSION: Anticipate progression to more overhead strengthening, long lever GH strengthening, and promoting increased self efficacy with program     Ashley Murrain PT, DPT 08/18/2022 2:25 PM

## 2022-08-18 ENCOUNTER — Ambulatory Visit: Payer: Medicaid Other | Admitting: Physical Therapy

## 2022-08-18 ENCOUNTER — Encounter: Payer: Self-pay | Admitting: Physical Therapy

## 2022-08-18 DIAGNOSIS — G8929 Other chronic pain: Secondary | ICD-10-CM | POA: Diagnosis not present

## 2022-08-18 DIAGNOSIS — M25512 Pain in left shoulder: Secondary | ICD-10-CM | POA: Diagnosis not present

## 2022-08-18 DIAGNOSIS — M6281 Muscle weakness (generalized): Secondary | ICD-10-CM

## 2022-08-18 DIAGNOSIS — M25612 Stiffness of left shoulder, not elsewhere classified: Secondary | ICD-10-CM

## 2022-08-19 ENCOUNTER — Ambulatory Visit: Payer: Medicaid Other | Admitting: Physical Therapy

## 2022-08-19 ENCOUNTER — Encounter: Payer: Self-pay | Admitting: Physical Therapy

## 2022-08-19 DIAGNOSIS — M25612 Stiffness of left shoulder, not elsewhere classified: Secondary | ICD-10-CM | POA: Diagnosis not present

## 2022-08-19 DIAGNOSIS — M25512 Pain in left shoulder: Secondary | ICD-10-CM | POA: Diagnosis not present

## 2022-08-19 DIAGNOSIS — M6281 Muscle weakness (generalized): Secondary | ICD-10-CM | POA: Diagnosis not present

## 2022-08-19 DIAGNOSIS — G8929 Other chronic pain: Secondary | ICD-10-CM | POA: Diagnosis not present

## 2022-08-19 NOTE — Therapy (Signed)
OUTPATIENT PHYSICAL THERAPY TREATMENT NOTE    Patient Name: Jonathon Harris MRN: [id number] DOB:01-26-1976, 47 y.o., male Today's Date: 08/19/2022  PCP: Anne Ng, NP   REFERRING PROVIDER: Anne Ng, NP  END OF SESSION:   PT End of Session - 08/19/22 1017     Visit Number 14    Number of Visits 17    Date for PT Re-Evaluation 09/14/22    Authorization Type [employer]    Authorization Time Period 6 visits 08/13/22-09/26/22    Authorization - Visit Number 2    Authorization - Number of Visits 6    PT Start Time 1017    PT Stop Time 1056    PT Time Calculation (min) 39 min    Activity Tolerance Patient tolerated treatment well;No increased pain    Behavior During Therapy [facility] for tasks assessed/performed              Past Medical History:  Diagnosis Date   Elevated BP without diagnosis of hypertension 05/14/2016   Thyroid disease    hyperactive- no medications at this time   Past Surgical History:  Procedure Laterality Date   CYST EXCISION     chest   Patient Active Problem List   Diagnosis Date Noted   Pseudopolyposis of colon without complication, unspecified part of colon 05/08/2022   Radiculopathy 02/07/2021   Benign colon polyp 08/02/2020   Low TSH level 03/15/2020   Prediabetes 02/09/2020   Family hx of colon cancer 02/01/2020   History of tobacco use 02/01/2020   Hyperlipidemia 05/12/2016   Chronic bilateral back pain 02/10/2016    REFERRING DIAG: M25.512,G89.29 (ICD-10-CM) - Chronic left shoulder pain 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  THERAPY DIAG:  Chronic left shoulder pain  Stiffness of left shoulder, not elsewhere classified  Muscle weakness (generalized)  Rationale for Evaluation and Treatment Rehabilitation  PERTINENT HISTORY: Thyroid, hx tobacco  PRECAUTIONS: none  SUBJECTIVE:                                                                                                                                                                                       SUBJECTIVE STATEMENT:   Pt arrives w/ 3/10 pain which he attributes to stress, felt good after yesterday's session    PAIN:  Are you having pain: yes,  3/10 Location/description: L shoulder, dull aching, pulling, deep Best-worst over past week: 0-4/10 ( on eval 2-6/10 ) Per eval -  - aggravating factors: sleeping on L, reaching overhead or to side, lifting heavy items, sitting for prolonged periods - Easing factors: movement, stretching, medication   OBJECTIVE: (objective measures completed at initial evaluation unless  otherwise dated)   DIAGNOSTIC FINDINGS:  L GH XR unremarkable for acute abnormalities per chart review   PATIENT SURVEYS:  FOTO: 55% current, 67% predicted  07/20/22 FOTO 55% 08/03/2022  57%   COGNITION: Overall cognitive status: Within functional limits for tasks assessed                                  SENSATION: WFL B UE   POSTURE: Grossly WNL, mild FHP   UPPER EXTREMITY ROM:   A/PROM Right eval Left eval Left 06/26/22 Left 06/2522 AROM 08/12/22 AROM  Shoulder flexion 178 deg 150 deg * 160 Pn  161 deg mild pain at end range 165 deg painless  Shoulder abduction 175 deg 158 deg * 160 160 deg p! 166 deg mild pulling  Shoulder internal rotation         Shoulder external rotation         Elbow flexion         Elbow extension         Wrist flexion         Wrist extension          (Blank rows = not tested) (Key: WFL = within functional limits not formally assessed, * = concordant pain, s = stiffness/stretching sensation, NT = not tested)  Comments:    UPPER EXTREMITY MMT:   MMT Right eval Left eval R/L  07/20/22 R/L 08/12/22   Shoulder flexion 5 4 * 5/4+ p! 5/4+ (pulling, nonpainful)  Shoulder extension        Shoulder abduction 5 4 *  5/4+ p! 5/5 painless   Shoulder extension        Shoulder internal rotation 5 4 (elbow) 5/4+ p! 5/4+ mild elbow pain  Shoulder external rotation 5 4 - * 5/4+ p!  5/4+ mild pain  Elbow flexion   5    Elbow extension   4+ (posterior shoulder pain)    Grip strength        (Blank rows = not tested)  (Key: WFL = within functional limits not formally assessed, * = concordant pain, s = stiffness/stretching sensation, NT = not tested)  Comments:      PALPATION:  Concordant tenderness L infraspinatus/teres, mild tenderness L LS, lat, deltoid, bicipital groove             TODAY'S TREATMENT:                [facility] Adult PT Treatment:                                                DATE: 08/19/22 Therapeutic Exercise: RTB wrist, BIL scaption x15 cues for pacing  Pulsing BIL scaption RTB around wrist, 3x5 cues for pacing and posture  Arnold press 5# LUE only, seated, 3x5 cues for posture and truncal extension Chest press machine 20# x8, 30# x8 cues for setup Lat pull down 45# 2x8 cues for setup Dicussed addition of pulses to scaption to HEP for increased time under tension  Manual Therapy: Sidelying STM to rhomboids/mid trap, infraspinatus. Active pin and stretch rhomboid trigger points, passive pin and stretch. Trigger point release rhomboids/mid trap Seated STM + trigger point release levator scap, active pin and stretch scapular insertion of LS into cervical flexion 1x5   [facility]  Adult PT Treatment:                                                DATE: 08/18/22 Therapeutic Exercise: UBE fwd/40min back  3# DB 3 way lat raise x5 each way, 5# 2x5 each way cues for form and appropriate ROM  Quadruped 3 way reach GTB 2x5 BIL UE  5# waiter's carry 3x60ft BIL UE cue for form and posture  5# shoulder shrugs 2x10 cues for eccentric emphasis and breath control  Education on relevant anatomy/physiology as it pertains to exercise in session, HEP, and general exercise outside of sessions (provided with GTB to progress program at home)  Manual Therapy: Sidelying STM to rhomboids/mid trap, infraspinatus. Active pin and stretch rhomboid trigger points, passive pin and  stretch. Trigger point release rhomboids/mid trap Seated STM + trigger point release levator scap, active pin and stretch scapular insertion of LS into cervical flexion 2x5   [facility] Adult PT Treatment:                                                DATE: 08/12/22 Therapeutic Exercise: Swiss ball up wall + end range push x12 cues for setup and range Swiss ball perturbations at 120 deg 2x30sec cues for setup Seated row machine 35# 2x12 cues for scapular mechanics Seated high row machine 20# 2x8  Education throughout on relevant anatomy/physiology as it pertains to exercises performed in clinic and with exercises pt performs at gym  Manual Therapy: Sidelying; STM L rhomboid/mid trap, low trap. Trigger point release rhomboid with active pin and stretch   Therapeutic Activity: MSK assessment + education Discussion re: relevant activities, remaining deficits, and PT POC     [facility] Adult PT Treatment:                                                DATE: 08/05/22 Therapeutic Exercise: UBE total (2.5 fwd/back) Quadruped 3 way reach w RTB 2x5 cues for sequencing  Prone LUE ER with UE at ~90 deg abd, x12 BW cues for reduced compensations; x8 w 2# CC high>low row 13# LUE x12, 17#  Cable tricep pulldown 5# 2x12 cues for form and control  Manual Therapy: Sidelying; STM L rhomboid, infraspinatus, middle trap, post delt. Trigger point release rhomboid/mid trap + active pin and stretch      PATIENT EDUCATION: Education details: rationale for interventions Person educated: Patient Education method: Explanation, Demonstration, Tactile cues, Verbal cues, and Handouts Education comprehension: verbalized understanding, returned demonstration, verbal cues required, tactile cues required, and needs further education     HOME EXERCISE PROGRAM: Access Code: [email or web address] URL: [email or web address] Date: 07/29/2022 Prepared by: Fransisco Hertz  Program Notes - add red band around wrist for  scaption exercise- provided blue band on 07/29/22 to progress resistance   Exercises - Standing Isometric Shoulder External Rotation with Doorway  - 1 x daily - 7 x weekly - 3 sets - 5 reps - Supine Shoulder Horizontal Abduction with Resistance  - 1 x daily - 7 x weekly - 2 sets - 10 reps - Supine  shoulder external rotation- KNEES BENT  - 1 x daily - 7 x weekly - 2 sets - 10 reps - Alternating star pattern  - 1 x daily - 7 x weekly - 2 sets - 10 reps - Standing Shoulder Scaption  - 1 x daily - 7 x weekly - 2 sets - 12 reps - Lower Trap Wall slides  - 1 x daily - 7 x weekly - 2 sets - 10 reps - Standing Sleeper Stretch at [employer]  - 1 x daily - 7 x weekly - 2 sets - 2 reps - 30 seconds hold    ASSESSMENT:   CLINICAL IMPRESSION: Pt arrives w/ 3/10 pain on NPS which he attributes to life stressors, states he felt good after yesterday's session. Today focusing on continued progression of GH/periscapular strength/endurance, increased complexity of GH movements into overhead positions. Pt tolerates quite well with report of muscular fatigue but no increase in pain. Continues to have trigger points lingering in L rhomboid/mid trap that responds well to active pin and stretch and trigger point release at end of session. No adverse events, pt departs with report of improved symptoms post manual, states he is interested in more dry needling for trigger points. Recommend continuing along current POC in order to address relevant deficits and improve functional tolerance. Pt departs today's session in no acute distress, all voiced questions/concerns addressed appropriately from PT perspective.       OBJECTIVE IMPAIRMENTS: decreased activity tolerance, decreased endurance, decreased mobility, decreased ROM, decreased strength, hypomobility, impaired UE functional use, and pain.    ACTIVITY LIMITATIONS: carrying, sitting, sleeping, and reach over head   PARTICIPATION LIMITATIONS: meal prep, cleaning, and  community activity   PERSONAL FACTORS: Time since onset of injury/illness/exacerbation are also affecting patient's functional outcome.    REHAB POTENTIAL: Good   CLINICAL DECISION MAKING: Stable/uncomplicated   EVALUATION COMPLEXITY: Low     GOALS: Goals reviewed with patient? No   SHORT TERM GOALS: Target date: 06/29/2022   Pt will demonstrate appropriate understanding and performance of initially prescribed HEP in order to facilitate improved independence with management of symptoms.  Baseline: HEP provided on eval 07/03/22: I and compliant Goal status: MET    2. Pt will score greater than or equal to 61% on FOTO in order to demonstrate improved perception of function due to symptoms.            Baseline: 55%  07/03/22: deferred due to pain increase   07/20/22: 55%            Goal status: ONGOING 08/03/2022   LONG TERM GOALS: Target date: 09/02/2022 (updated 08/12/22 to accommodate remaining visits in POC) Pt will score 67% on FOTO in order to demonstrate improved perception of function due to symptoms. Baseline: 55% 07/20/22: 55% Goal status: ONGOING 08/03/2022   2.  Pt will demonstrate at least 165 degrees of active L shoulder elevation in order to demonstrate improved tolerance to functional movement patterns such as reaching overhead.  Baseline: see ROM chart above 07/20/22: 161 deg flex, 160 abd 08/12/22: >/= 165 deg flex and abd Goal status: MET 08/12/22   3.  Pt will demonstrate at least 4+/5 shoulder ER/IR MMT for improved symmetry of UE strength and improved tolerance to functional movements.  Baseline: see MMT chart above 07/20/22: 4+/5 rotational MMT but painful 08/22/22: IR without shoulder pain, ER with mild pain Goal status: NEARLY MET 08/12/22   4. Pt will report/demonstrate ability to perform overhead reach with less than  2 point increase in pain on NPS in order to indicative improved tolerance/independence with functional tasks such as cleaning/dressing.             Baseline: up to 8/10 with daily activities  07/20/22: continues to have increase in pain w/ overhead activities, up to 6/10   08/12/22: up to 4/10 with daily activities            Goal status: PROGRESSING 08/12/2022             5. Pt will report ability to sleep through the night with less than 2 interruptions due to L shoulder pain in order to facilitate improved overall health and QOL.                       Baseline: waking 2-3 times a night on average 07/20/22: pt states sleep has much improved since starting PT, wakes him up a couple times a week                      Goal status: MET     PLAN (updated 08/12/22):   PT FREQUENCY: 2x/week     PT DURATION: 3 weeks   PLANNED INTERVENTIONS: Therapeutic exercises, Therapeutic activity, Neuromuscular re-education, Balance training, Gait training, Patient/Family education, Self Care, Joint mobilization, Joint manipulation, Dry Needling, Electrical stimulation, Spinal manipulation, Spinal mobilization, Cryotherapy, Moist heat, Taping, Manual therapy, and Re-evaluation   PLAN FOR NEXT SESSION: Anticipate continued progression to more overhead strengthening, long lever GH strengthening, and promoting increased self efficacy with program      Ashley Murrain PT, DPT 08/19/2022 12:41 PM

## 2022-08-25 NOTE — Therapy (Signed)
OUTPATIENT PHYSICAL THERAPY TREATMENT NOTE    Patient Name: Jonathon Harris MRN: [id number] DOB:Oct 28, 1975, 47 y.o., male Today's Date: 08/27/2022  PCP: Anne Ng, NP   REFERRING PROVIDER: Anne Ng, NP  END OF SESSION:   PT End of Session - 08/27/22 1014     Visit Number 15    Number of Visits 17    Date for PT Re-Evaluation 09/14/22    Authorization Type [employer]    Authorization Time Period 6 visits 08/13/22-09/26/22    Authorization - Visit Number 3    Authorization - Number of Visits 6    PT Start Time 1015    PT Stop Time 1058    PT Time Calculation (min) 43 min    Activity Tolerance Patient tolerated treatment well;No increased pain    Behavior During Therapy [facility] for tasks assessed/performed               Past Medical History:  Diagnosis Date   Elevated BP without diagnosis of hypertension 05/14/2016   Thyroid disease    hyperactive- no medications at this time   Past Surgical History:  Procedure Laterality Date   CYST EXCISION     chest   Patient Active Problem List   Diagnosis Date Noted   Pseudopolyposis of colon without complication, unspecified part of colon (HCC) 05/08/2022   Radiculopathy 02/07/2021   Benign colon polyp 08/02/2020   Low TSH level 03/15/2020   Prediabetes 02/09/2020   Family hx of colon cancer 02/01/2020   History of tobacco use 02/01/2020   Hyperlipidemia 05/12/2016   Chronic bilateral back pain 02/10/2016    REFERRING DIAG: M25.512,G89.29 (ICD-10-CM) - Chronic left shoulder pain 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    PAIN:  Are you having pain: yes,  3/10 Location/description: L shoulder, dull aching, pulling, deep Best-worst over past week: 0-4/10 ( on eval 2-6/10 ) Per eval -  - aggravating factors: sleeping on L, reaching overhead or to side, lifting heavy items, sitting for prolonged periods - Easing factors: movement, stretching, medication  OBJECTIVE: (objective measures completed at initial evaluation unless otherwise dated)   DIAGNOSTIC FINDINGS:  L GH XR unremarkable for acute abnormalities per chart review   PATIENT SURVEYS:  FOTO: 55% current, 67% predicted  07/20/22 FOTO 55% 08/03/2022  57%   COGNITION: Overall cognitive status: Within functional limits for tasks assessed                                  SENSATION: WFL B UE   POSTURE: Grossly WNL, mild FHP   UPPER EXTREMITY ROM:   A/PROM Right eval Left eval Left 06/26/22 Left 06/2522 AROM 08/12/22 AROM 08-27-22 AROM  Shoulder flexion 178 deg 150 deg * 160 Pn  161 deg mild pain at end range 165 deg painless 168 painless  Shoulder abduction 175 deg 158 deg * 160 160 deg p! 166 deg mild pulling   Shoulder internal rotation          Shoulder external rotation          Elbow flexion          Elbow extension          Wrist flexion          Wrist extension           (Blank rows = not tested) (Key: WFL = within functional limits not formally assessed, * = concordant pain, s = stiffness/stretching sensation, NT = not tested)  Comments:    UPPER EXTREMITY MMT:   MMT Right eval Left eval R/L  07/20/22 R/L 08/12/22   Shoulder flexion 5 4 * 5/4+ p! 5/4+ (pulling, nonpainful)  Shoulder extension        Shoulder abduction 5 4 *  5/4+ p! 5/5 painless   Shoulder extension        Shoulder internal rotation 5 4 (elbow) 5/4+ p! 5/4+  mild elbow pain  Shoulder external rotation 5 4 - * 5/4+ p! 5/4+ mild pain  Elbow flexion   5    Elbow extension   4+ (posterior shoulder pain)    Grip strength        (Blank rows = not tested)  (Key: WFL = within functional limits not formally assessed, * = concordant pain, s = stiffness/stretching sensation, NT = not tested)  Comments:      PALPATION:  Concordant tenderness L infraspinatus/teres, mild tenderness L LS, lat, deltoid, bicipital groove             TODAY'S TREATMENT: [facility] Adult PT Treatment:                                                DATE: 08-27-22 Therapeutic Exercise: Star pattern 3 x 10 with BTB with VC for slowing down and not using momentum OH press with 15 # KB on L 2 x 10 Ball on wall  in shld elbow 90/90 position [employer] with L UE at 90/90 for 80 ft x 2 Bent over Unilateral row with L UE 2 x 10 with 25 # KB  Manual Therapy:  STW to Left periscapular muscles especially rhomboids, infraspinatus and subscapularis, Myofascial release of supscapularis   R sidelying and PT holding scapula for increased  posterior capsule stretch Trigger Point Dry-Needling performed  by Garen Lah PT Treatment instructions: Expect mild to moderate muscle  soreness. S/S of pneumothorax if dry needled over a lung field, and to seek immediate medical attention should they occur. Patient verbalized understanding of these instructions and education.  Patient Consent Given: Yes Education handout provided: Previously provided Muscles treated: L shld only  subscapularis on spine side and laterally, UT, infraspinatus Electrical stimulation performed: yes  9 min Parameters: e stim  to pt tolerance at 25 ma Treatment response/outcome: twitch response noted, pt noted relief          [facility] Adult PT Treatment:                                                DATE: 08/19/22 Therapeutic Exercise: RTB wrist, BIL scaption x15 cues for pacing  Pulsing BIL scaption RTB around wrist, 3x5 cues  for pacing and posture  Arnold press 5# LUE only, seated, 3x5 cues for posture and truncal extension Chest press machine 20# x8, 30# x8 cues for setup Lat pull down 45# 2x8 cues for setup Dicussed addition of pulses to scaption to HEP for increased time under tension  Manual Therapy: Sidelying STM to rhomboids/mid trap, infraspinatus. Active pin and stretch rhomboid trigger points, passive pin and stretch. Trigger point release rhomboids/mid trap Seated STM + trigger point release levator scap, active pin and stretch scapular insertion of LS into cervical flexion 1x5   [facility] Adult PT Treatment:                                                DATE: 08/18/22 Therapeutic Exercise: UBE fwd/34min back  3# DB 3 way lat raise x5 each way, 5# 2x5 each way cues for form and appropriate ROM  Quadruped 3 way reach GTB 2x5 BIL UE  5# waiter's carry 3x45ft BIL UE cue for form and posture  5# shoulder shrugs 2x10 cues for eccentric emphasis and breath control  Education on relevant anatomy/physiology as it pertains to exercise in session, HEP, and general exercise outside of sessions (provided with GTB to progress program at home)  Manual Therapy: Sidelying STM to rhomboids/mid trap, infraspinatus. Active pin and stretch rhomboid trigger points, passive pin and stretch. Trigger point release rhomboids/mid trap Seated STM + trigger point release levator scap, active pin and stretch scapular insertion of LS into cervical flexion 2x5   [facility] Adult PT Treatment:                                                DATE: 08/12/22 Therapeutic Exercise: Swiss ball up wall + end range push x12 cues for setup and range Swiss ball perturbations at 120 deg 2x30sec cues for setup Seated row machine 35# 2x12 cues for scapular mechanics Seated high row machine 20# 2x8  Education throughout on relevant anatomy/physiology as it pertains to exercises performed in clinic and with exercises pt performs at gym  Manual  Therapy: Sidelying; STM L rhomboid/mid trap, low trap. Trigger point release rhomboid with active pin and stretch   Therapeutic Activity: MSK assessment + education Discussion re: relevant activities, remaining deficits, and PT POC     [facility] Adult PT  Treatment:                                                DATE: 08/05/22 Therapeutic Exercise: UBE total (2.5 fwd/back) Quadruped 3 way reach w RTB 2x5 cues for sequencing  Prone LUE ER with UE at ~90 deg abd, x12 BW cues for reduced compensations; x8 w 2# CC high>low row 13# LUE x12, 17#  Cable tricep pulldown 5# 2x12 cues for form and control  Manual Therapy: Sidelying; STM L rhomboid, infraspinatus, middle trap, post delt. Trigger point release rhomboid/mid trap + active pin and stretch      PATIENT EDUCATION: Education details: rationale for interventions Person educated: Patient Education method: Explanation, Demonstration, Tactile cues, Verbal cues, and Handouts Education comprehension: verbalized understanding, returned demonstration, verbal cues required, tactile cues required, and needs further education     HOME EXERCISE PROGRAM: Access Code: [email or web address] URL: [email or web address] Date: 07/29/2022 Prepared by: Fransisco Hertz  Program Notes - add red band around wrist for scaption exercise- provided blue band on 07/29/22 to progress resistance   Exercises - Standing Isometric Shoulder External Rotation with Doorway  - 1 x daily - 7 x weekly - 3 sets - 5 reps - Supine Shoulder Horizontal Abduction with Resistance  - 1 x daily - 7 x weekly - 2 sets - 10 reps - Supine shoulder external rotation- KNEES BENT  - 1 x daily - 7 x weekly - 2 sets - 10 reps - Alternating star pattern  - 1 x daily - 7 x weekly - 2 sets - 10 reps - Standing Shoulder Scaption  - 1 x daily - 7 x weekly - 2 sets - 12 reps - Lower Trap Wall slides  - 1 x daily - 7 x weekly - 2 sets - 10 reps - Standing Sleeper Stretch at Wall  - 1 x daily  - 7 x weekly - 2 sets - 2 reps - 30 seconds hold    ASSESSMENT:   CLINICAL IMPRESSION: Pt arrives w/ 3/10 pain on NPS which he attributes to life stressors, states he felt good after yesterday's session. Today focusing on continued progression of GH/periscapular strength/endurance, increased complexity of GH movements into overhead positions. Pt tolerates quite well with report of muscular fatigue but no increase in pain. Continues to have trigger points lingering in L rhomboid/mid trap that responds well to active pin and stretch and trigger point release at end of session. No adverse events, pt departs with report of improved symptoms post manual, states he is interested in more dry needling for trigger points. Recommend continuing along current POC in order to address relevant deficits and improve functional tolerance. Pt departs today's session in no acute distress, all voiced questions/concerns addressed appropriately from PT perspective.       OBJECTIVE IMPAIRMENTS: decreased activity tolerance, decreased endurance, decreased mobility, decreased ROM, decreased strength, hypomobility, impaired UE functional use, and pain.    ACTIVITY LIMITATIONS: carrying, sitting, sleeping, and reach over head   PARTICIPATION LIMITATIONS: meal prep, cleaning, and community activity   PERSONAL FACTORS: Time since onset of injury/illness/exacerbation are also affecting patient's functional outcome.    REHAB POTENTIAL: Good   CLINICAL DECISION MAKING: Stable/uncomplicated   EVALUATION COMPLEXITY: Low     GOALS: Goals reviewed with patient? No   SHORT TERM GOALS: Target date: 06/29/2022   Pt will demonstrate  appropriate understanding and performance of initially prescribed HEP in order to facilitate improved independence with management of symptoms.  Baseline: HEP provided on eval 07/03/22: I and compliant Goal status: MET    2. Pt will score greater than or equal to 61% on FOTO in order to  demonstrate improved perception of function due to symptoms.            Baseline: 55%  07/03/22: deferred due to pain increase   07/20/22: 55%            Goal status: ONGOING 08/03/2022   LONG TERM GOALS: Target date: 09/02/2022 (updated 08/12/22 to accommodate remaining visits in POC) Pt will score 67% on FOTO in order to demonstrate improved perception of function due to symptoms. Baseline: 55% 07/20/22: 55% Goal status: ONGOING 08/03/2022   2.  Pt will demonstrate at least 165 degrees of active L shoulder elevation in order to demonstrate improved tolerance to functional movement patterns such as reaching overhead.  Baseline: see ROM chart above 07/20/22: 161 deg flex, 160 abd 08/12/22: >/= 165 deg flex and abd Goal status: MET 08/12/22   3.  Pt will demonstrate at least 4+/5 shoulder ER/IR MMT for improved symmetry of UE strength and improved tolerance to functional movements.  Baseline: see MMT chart above 07/20/22: 4+/5 rotational MMT but painful 08/22/22: IR without shoulder pain, ER with mild pain Goal status: NEARLY MET 08/12/22   4. Pt will report/demonstrate ability to perform overhead reach with less than 2 point increase in pain on NPS in order to indicative improved tolerance/independence with functional tasks such as cleaning/dressing.            Baseline: up to 8/10 with daily activities  07/20/22: continues to have increase in pain w/ overhead activities, up to 6/10   08/12/22: up to 4/10 with daily activities            Goal status: PROGRESSING 08/12/2022             5. Pt will report ability to sleep through the night with less than 2 interruptions due to L shoulder pain in order to facilitate improved overall health and QOL.                       Baseline: waking 2-3 times a night on average 07/20/22: pt states sleep has much improved since starting PT, wakes him up a couple times a week                      Goal status: MET     PLAN (updated 08/12/22):   PT FREQUENCY: 2x/week      PT DURATION: 3 weeks   PLANNED INTERVENTIONS: Therapeutic exercises, Therapeutic activity, Neuromuscular re-education, Balance training, Gait training, Patient/Family education, Self Care, Joint mobilization, Joint manipulation, Dry Needling, Electrical stimulation, Spinal manipulation, Spinal mobilization, Cryotherapy, Moist heat, Taping, Manual therapy, and Re-evaluation   PLAN FOR NEXT SESSION: Anticipate continued progression to more overhead strengthening, long lever GH strengthening, and promoting increased self efficacy with program     Garen Lah, PT, ATRIC Certified Exercise Expert for the Aging Adult  08/27/22 12:48 PM Phone: [phone] Fax: [phone]

## 2022-08-26 DIAGNOSIS — Z419 Encounter for procedure for purposes other than remedying health state, unspecified: Secondary | ICD-10-CM | POA: Diagnosis not present

## 2022-08-27 ENCOUNTER — Encounter: Payer: Self-pay | Admitting: Physical Therapy

## 2022-08-27 ENCOUNTER — Ambulatory Visit: Payer: Medicaid Other | Attending: Nurse Practitioner | Admitting: Physical Therapy

## 2022-08-27 DIAGNOSIS — M25612 Stiffness of left shoulder, not elsewhere classified: Secondary | ICD-10-CM | POA: Diagnosis not present

## 2022-08-27 DIAGNOSIS — M6281 Muscle weakness (generalized): Secondary | ICD-10-CM | POA: Insufficient documentation

## 2022-08-27 DIAGNOSIS — M25512 Pain in left shoulder: Secondary | ICD-10-CM | POA: Diagnosis not present

## 2022-08-27 DIAGNOSIS — G8929 Other chronic pain: Secondary | ICD-10-CM | POA: Diagnosis not present

## 2022-09-02 ENCOUNTER — Ambulatory Visit: Payer: Medicaid Other | Admitting: Physical Therapy

## 2022-09-02 ENCOUNTER — Encounter: Payer: Self-pay | Admitting: Physical Therapy

## 2022-09-02 DIAGNOSIS — G8929 Other chronic pain: Secondary | ICD-10-CM | POA: Diagnosis not present

## 2022-09-02 DIAGNOSIS — M6281 Muscle weakness (generalized): Secondary | ICD-10-CM | POA: Diagnosis not present

## 2022-09-02 DIAGNOSIS — M25612 Stiffness of left shoulder, not elsewhere classified: Secondary | ICD-10-CM | POA: Diagnosis not present

## 2022-09-02 DIAGNOSIS — M25512 Pain in left shoulder: Secondary | ICD-10-CM | POA: Diagnosis not present

## 2022-09-02 NOTE — Patient Instructions (Signed)
TENS stands for Transcutaneous Electrical Nerve Stimulation. In other words, electrical impulses are allowed to pass through the skin in order to excite a nerve.   Purpose and Use of TENS:  TENS is a method used to manage acute and chronic pain without the use of drugs. It has been effective in managing pain associated with surgery, sprains, strains, trauma, rheumatoid arthritis, and neuralgias. It is a non-addictive, low risk, and non-invasive technique used to control pain. It is not, by any means, a curative form of treatment.   How TENS Works:  Most TENS units are a small pocket-sized unit powered by one 9 volt battery. Attached to the outside of the unit are two lead wires where two pins and/or snaps connect on each wire. All units come with a set of four reusable pads or electrodes. These are placed on the skin surrounding the area involved. By inserting the leads into  the pads, the electricity can pass from the unit making the circuit complete.  As the intensity is turned up slowly, the electrical current enters the body from the electrodes through the skin to the surrounding nerve fibers. This triggers the release of hormones from within the body. These hormones contain pain relievers. By increasing the circulation of these hormones, the person's pain may be lessened. It is also believed that the electrical stimulation itself helps to block the pain messages being sent to the brain, thus also decreasing the body's perception of pain.   Hazards:  TENS units are NOT to be used by patients with PACEMAKERS, DEFIBRILLATORS, DIABETIC PUMPS, PREGNANT WOMEN, and patients with SEIZURE DISORDERS.  TENS units are NOT to be used over the heart, throat, brain, or spinal cord.  One of the major side effects from the TENS unit may be skin irritation. Some people may develop a rash if they are sensitive to the materials used in the electrodes or the connecting wires.     Avoid overuse due the body getting  used to the stem making it not as effective over time.     

## 2022-09-02 NOTE — Therapy (Signed)
OUTPATIENT PHYSICAL THERAPY TREATMENT NOTE    Patient Name: BHUVAN VILLAGRAN MRN: [id number] DOB:[birth year], 47 y.o., male Today's Date: 09/02/2022  PCP: Anne Ng, NP   REFERRING PROVIDER: Anne Ng, NP  END OF SESSION:   PT End of Session - 09/02/22 1018     Visit Number 16    Number of Visits 17    Date for PT Re-Evaluation 09/14/22    Authorization Type [employer]    Authorization Time Period 6 visits 08/13/22-09/26/22    Authorization - Visit Number 4    Authorization - Number of Visits 6    PT Start Time 1018    PT Stop Time 1100    PT Time Calculation (min) 42 min    Activity Tolerance Patient tolerated treatment well;No increased pain    Behavior During Therapy [facility] for tasks assessed/performed                Past Medical History:  Diagnosis Date   Elevated BP without diagnosis of hypertension 05/14/2016   Thyroid disease    hyperactive- no medications at this time   Past Surgical History:  Procedure Laterality Date   CYST EXCISION     chest   Patient Active Problem List   Diagnosis Date Noted   Pseudopolyposis of colon without complication, unspecified part of colon (HCC) 05/08/2022   Radiculopathy 02/07/2021   Benign colon polyp 08/02/2020   Low TSH level 03/15/2020   Prediabetes 02/09/2020   Family hx of colon cancer 02/01/2020   History of tobacco use 02/01/2020   Hyperlipidemia 05/12/2016   Chronic bilateral back pain 02/10/2016    REFERRING DIAG: M25.512,G89.29 (ICD-10-CM) - Chronic left shoulder pain 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  THERAPY DIAG:  Chronic left shoulder pain  Stiffness of left shoulder, not elsewhere classified  Muscle weakness (generalized)  Rationale for Evaluation and Treatment Rehabilitation  PERTINENT HISTORY: Thyroid, hx tobacco  PRECAUTIONS: none  SUBJECTIVE:                                                                                                                                                                                       SUBJECTIVE STATEMENT:   "I am doing okay today, I had a very stressfull weekend."    PAIN:  Are you having pain: yes,  4 Location/description: L shoulder, dull aching, pulling, deep Best-worst over past week: 0-4/10 ( on eval 2-6/10 ) Per eval -  - aggravating factors: sleeping on L, reaching overhead or to side, lifting heavy items, sitting for prolonged periods - Easing factors: movement, stretching, medication   OBJECTIVE: (objective measures completed at initial evaluation unless otherwise  dated)   DIAGNOSTIC FINDINGS:  L GH XR unremarkable for acute abnormalities per chart review   PATIENT SURVEYS:  FOTO: 55% current, 67% predicted  07/20/22 FOTO 55% 08/03/2022  57%   COGNITION: Overall cognitive status: Within functional limits for tasks assessed                                  SENSATION: WFL B UE   POSTURE: Grossly WNL, mild FHP   UPPER EXTREMITY ROM:   A/PROM Right eval Left eval Left 06/26/22 Left 06/2522 AROM 08/12/22 AROM 08-27-22 AROM  Shoulder flexion 178 deg 150 deg * 160 Pn  161 deg mild pain at end range 165 deg painless 168 painless  Shoulder abduction 175 deg 158 deg * 160 160 deg p! 166 deg mild pulling   Shoulder internal rotation          Shoulder external rotation          Elbow flexion          Elbow extension          Wrist flexion          Wrist extension           (Blank rows = not tested) (Key: WFL = within functional limits not formally assessed, * = concordant pain, s = stiffness/stretching sensation, NT = not tested)  Comments:    UPPER EXTREMITY MMT:   MMT Right eval Left eval R/L  07/20/22 R/L 08/12/22   Shoulder flexion 5 4 * 5/4+ p! 5/4+ (pulling, nonpainful)  Shoulder extension        Shoulder abduction 5 4 *  5/4+ p! 5/5 painless   Shoulder extension        Shoulder internal rotation 5 4 (elbow) 5/4+ p! 5/4+ mild elbow pain  Shoulder external rotation 5 4 - * 5/4+  p! 5/4+ mild pain  Elbow flexion   5    Elbow extension   4+ (posterior shoulder pain)    Grip strength        (Blank rows = not tested)  (Key: WFL = within functional limits not formally assessed, * = concordant pain, s = stiffness/stretching sensation, NT = not tested)  Comments:      PALPATION:  Concordant tenderness L infraspinatus/teres, mild tenderness L LS, lat, deltoid, bicipital groove             TODAY'S TREATMENT: [facility] Adult PT Treatment:                                                DATE: 09/02/22 Therapeutic Exercise: UBE L5 x 5 min ( FWD/BWD x 2:30 sec) EMOM x 2 sets Bent over barbell (verbal cues for proper form, 1st set 45# bar, 2nd set 25# bar Standing scapular protraction with foam roll with lower trap activation with RTB 2 x 10 Walking overhead 5# KB carrying with UE in 90/90  Body blade IR/ER Manual Therapy: MTPR along the posterior shoulder musculature And taught how to use at home with theracane Tack and stretch of the levator/ upper trap.   Self Care: Provided handout/ information for Tens unit infomration and pad placement.   [facility] Adult PT Treatment:  DATE: 08-27-22 Therapeutic Exercise: Star pattern 3 x 10 with BTB with VC for slowing down and not using momentum OH press with 15 # KB on L 2 x 10 Ball on wall  in shld elbow 90/90 position [employer] with L UE at 90/90 for 80 ft x 2 Bent over Unilateral row with L UE 2 x 10 with 25 # KB  Manual Therapy:  STW to Left periscapular muscles especially rhomboids, infraspinatus and subscapularis, Myofascial release of supscapularis   R sidelying and PT holding scapula for increased  posterior capsule stretch Trigger Point Dry-Needling performed  by Garen Lah PT Treatment instructions: Expect mild to moderate muscle soreness. S/S of pneumothorax if dry needled over a lung field, and to seek immediate medical attention should they occur. Patient verbalized  understanding of these instructions and education.  Patient Consent Given: Yes Education handout provided: Previously provided Muscles treated: L shld only  subscapularis on spine side and laterally, UT, infraspinatus Electrical stimulation performed: yes  9 min Parameters: e stim  to pt tolerance at 25 ma Treatment response/outcome: twitch response noted, pt noted relief          [facility] Adult PT Treatment:                                                DATE: 08/19/22 Therapeutic Exercise: RTB wrist, BIL scaption x15 cues for pacing  Pulsing BIL scaption RTB around wrist, 3x5 cues for pacing and posture  Arnold press 5# LUE only, seated, 3x5 cues for posture and truncal extension Chest press machine 20# x8, 30# x8 cues for setup Lat pull down 45# 2x8 cues for setup Dicussed addition of pulses to scaption to HEP for increased time under tension  Manual Therapy: Sidelying STM to rhomboids/mid trap, infraspinatus. Active pin and stretch rhomboid trigger points, passive pin and stretch. Trigger point release rhomboids/mid trap Seated STM + trigger point release levator scap, active pin and stretch scapular insertion of LS into cervical flexion 1x5    PATIENT EDUCATION: Education details: rationale for interventions Person educated: Patient Education method: Explanation, Demonstration, Tactile cues, Verbal cues, and Handouts Education comprehension: verbalized understanding, returned demonstration, verbal cues required, tactile cues required, and needs further education     HOME EXERCISE PROGRAM: Access Code: [email or web address] URL: [email or web address] Date: 07/29/2022 Prepared by: Fransisco Hertz  Program Notes - add red band around wrist for scaption exercise- provided blue band on 07/29/22 to progress resistance   Exercises - Standing Isometric Shoulder External Rotation with Doorway  - 1 x daily - 7 x weekly - 3 sets - 5 reps - Supine Shoulder Horizontal Abduction with  Resistance  - 1 x daily - 7 x weekly - 2 sets - 10 reps - Supine shoulder external rotation- KNEES BENT  - 1 x daily - 7 x weekly - 2 sets - 10 reps - Alternating star pattern  - 1 x daily - 7 x weekly - 2 sets - 10 reps - Standing Shoulder Scaption  - 1 x daily - 7 x weekly - 2 sets - 12 reps - Lower Trap Wall slides  - 1 x daily - 7 x weekly - 2 sets - 10 reps - Standing Sleeper Stretch at Wall  - 1 x daily - 7 x weekly - 2 sets - 2 reps - 30 seconds hold  ASSESSMENT:   CLINICAL IMPRESSION: Pt arrives to session noting having additional stress at home which is impacting his ability to perform his HEP. Focused on MTPR instead of  and using the theracane  and tack and stretch techniques to the L shoulder. He has a Theracane at home and was unsure of how to use it. Continued working on shoulder strengthening with OH activities utilizing emoms for strengthening. Reviewed tens unit information and pad placement providing handout. Pt has one more visit on the schedule and anticipate discharge at that time.      OBJECTIVE IMPAIRMENTS: decreased activity tolerance, decreased endurance, decreased mobility, decreased ROM, decreased strength, hypomobility, impaired UE functional use, and pain.    ACTIVITY LIMITATIONS: carrying, sitting, sleeping, and reach over head   PARTICIPATION LIMITATIONS: meal prep, cleaning, and community activity   PERSONAL FACTORS: Time since onset of injury/illness/exacerbation are also affecting patient's functional outcome.    REHAB POTENTIAL: Good   CLINICAL DECISION MAKING: Stable/uncomplicated   EVALUATION COMPLEXITY: Low     GOALS: Goals reviewed with patient? No   SHORT TERM GOALS: Target date: 06/29/2022   Pt will demonstrate appropriate understanding and performance of initially prescribed HEP in order to facilitate improved independence with management of symptoms.  Baseline: HEP provided on eval 07/03/22: I and compliant Goal status: MET    2. Pt  will score greater than or equal to 61% on FOTO in order to demonstrate improved perception of function due to symptoms.            Baseline: 55%  07/03/22: deferred due to pain increase   07/20/22: 55%            Goal status: ONGOING 08/03/2022   LONG TERM GOALS: Target date: 09/02/2022 (updated 08/12/22 to accommodate remaining visits in POC) Pt will score 67% on FOTO in order to demonstrate improved perception of function due to symptoms. Baseline: 55% 07/20/22: 55% Goal status: ONGOING 08/03/2022   2.  Pt will demonstrate at least 165 degrees of active L shoulder elevation in order to demonstrate improved tolerance to functional movement patterns such as reaching overhead.  Baseline: see ROM chart above 07/20/22: 161 deg flex, 160 abd 08/12/22: >/= 165 deg flex and abd Goal status: MET 08/12/22   3.  Pt will demonstrate at least 4+/5 shoulder ER/IR MMT for improved symmetry of UE strength and improved tolerance to functional movements.  Baseline: see MMT chart above 07/20/22: 4+/5 rotational MMT but painful 08/22/22: IR without shoulder pain, ER with mild pain Goal status: NEARLY MET 08/12/22   4. Pt will report/demonstrate ability to perform overhead reach with less than 2 point increase in pain on NPS in order to indicative improved tolerance/independence with functional tasks such as cleaning/dressing.            Baseline: up to 8/10 with daily activities  07/20/22: continues to have increase in pain w/ overhead activities, up to 6/10   08/12/22: up to 4/10 with daily activities            Goal status: PROGRESSING 08/12/2022             5. Pt will report ability to sleep through the night with less than 2 interruptions due to L shoulder pain in order to facilitate improved overall health and QOL.                       Baseline: waking 2-3 times a night on average 07/20/22: pt states  sleep has much improved since starting PT, wakes him up a couple times a week                      Goal status: MET      PLAN (updated 08/12/22):   PT FREQUENCY: 2x/week     PT DURATION: 3 weeks   PLANNED INTERVENTIONS: Therapeutic exercises, Therapeutic activity, Neuromuscular re-education, Balance training, Gait training, Patient/Family education, Self Care, Joint mobilization, Joint manipulation, Dry Needling, Electrical stimulation, Spinal manipulation, Spinal mobilization, Cryotherapy, Moist heat, Taping, Manual therapy, and Re-evaluation   PLAN FOR NEXT SESSION: Anticipate continued progression to more overhead strengthening, long lever GH strengthening, and promoting increased self efficacy with program     Demitrios Molyneux PT, DPT, LAT, ATC  09/02/22  11:28 AM

## 2022-09-10 ENCOUNTER — Encounter: Payer: Self-pay | Admitting: Physical Therapy

## 2022-09-10 ENCOUNTER — Ambulatory Visit: Payer: Medicaid Other | Admitting: Physical Therapy

## 2022-09-10 DIAGNOSIS — G8929 Other chronic pain: Secondary | ICD-10-CM | POA: Diagnosis not present

## 2022-09-10 DIAGNOSIS — M25512 Pain in left shoulder: Secondary | ICD-10-CM | POA: Diagnosis not present

## 2022-09-10 DIAGNOSIS — M6281 Muscle weakness (generalized): Secondary | ICD-10-CM | POA: Diagnosis not present

## 2022-09-10 DIAGNOSIS — M25612 Stiffness of left shoulder, not elsewhere classified: Secondary | ICD-10-CM | POA: Diagnosis not present

## 2022-09-10 NOTE — Therapy (Signed)
OUTPATIENT PHYSICAL THERAPY TREATMENT NOTE   PHYSICAL THERAPY DISCHARGE SUMMARY  Visits from Start of Care: 17  Current functional level related to goals / functional outcomes: See goals, FOTO 65%   Remaining deficits: See assessment   Education / Equipment: HEP, theraband, posture, lifting, mechanics.    Patient agrees to discharge. Patient goals were partially met. Patient is being discharged due to meeting the stated rehab goals.         Patient Name: Jonathon Harris MRN: [id number] DOB:Aug 07, 1975, 47 y.o., male Today's Date: 09/10/2022  PCP: Anne Ng, NP   REFERRING PROVIDER: Anne Ng, NP  END OF SESSION:   PT End of Session - 09/10/22 1418     Visit Number 17    Number of Visits 17    Date for PT Re-Evaluation 09/14/22    Authorization Type [employer]    Authorization Time Period 6 visits 08/13/22-09/26/22    Authorization - Visit Number 5    Authorization - Number of Visits 6    PT Start Time 1418    PT Stop Time 1512    PT Time Calculation (min) 54 min    Activity Tolerance Patient tolerated treatment well;No increased pain    Behavior During Therapy [facility] for tasks assessed/performed                 Past Medical History:  Diagnosis Date   Elevated BP without diagnosis of hypertension 05/14/2016   Thyroid disease    hyperactive- no medications at this time   Past Surgical History:  Procedure Laterality Date   CYST EXCISION     chest   Patient Active Problem List   Diagnosis Date Noted   Pseudopolyposis of colon without complication, unspecified part of colon (HCC) 05/08/2022   Radiculopathy 02/07/2021   Benign colon polyp 08/02/2020   Low TSH level 03/15/2020   Prediabetes 02/09/2020   Family hx of colon cancer 02/01/2020   History of tobacco use 02/01/2020   Hyperlipidemia 05/12/2016   Chronic bilateral back pain 02/10/2016    REFERRING DIAG: M25.512,G89.29 (ICD-10-CM) - Chronic left shoulder pain 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  I was sore after the last session for a few days but overall I am doing better."   PAIN:  Are you having pain: yes,  2/10 Location/description: L shoulder, dull aching, pulling, deep Best-worst over past week: 0-4/10 ( on eval 2-6/10 ) Per eval -  - aggravating factors: sleeping on L, reaching overhead or to side, lifting heavy items, sitting for prolonged periods - Easing factors: movement, stretching, medication   OBJECTIVE: (objective measures completed at initial evaluation unless otherwise dated)   DIAGNOSTIC FINDINGS:  L GH XR unremarkable for acute abnormalities per chart review   PATIENT SURVEYS:  FOTO: 55% current, 67% predicted  07/20/22 FOTO 55% 08/03/2022  57% 09/10/2022 65%   COGNITION: Overall cognitive status: Within functional limits for tasks assessed                                  SENSATION: WFL B UE   POSTURE: Grossly WNL, mild FHP   UPPER EXTREMITY ROM:   A/PROM Right eval Left eval Left 06/26/22 Left 06/2522 AROM 08/12/22 AROM 08-27-22 AROM  Shoulder flexion 178 deg 150 deg * 160 Pn  161 deg mild pain at end range 165 deg painless 168 painless  Shoulder abduction 175 deg 158 deg * 160 160 deg p! 166 deg mild pulling   Shoulder internal rotation          Shoulder external rotation          Elbow flexion          Elbow extension          Wrist flexion          Wrist extension           (Blank rows = not tested) (Key: WFL =  within functional limits not formally assessed, * = concordant pain, s = stiffness/stretching sensation, NT = not tested)  Comments:    UPPER EXTREMITY MMT:   MMT Right eval Left eval R/L  07/20/22 R/L 08/12/22  09/10/2022  Shoulder flexion 5 4 * 5/4+ p! 5/4+ (pulling, nonpainful)   Shoulder extension         Shoulder abduction 5 4 *  5/4+ p! 5/5 painless    Shoulder extension         Shoulder internal rotation 5 4 (elbow) 5/4+ p! 5/4+ mild elbow pain 5/5  Shoulder external rotation 5 4 - * 5/4+ p! 5/4+ mild pain 4+/5  Elbow flexion   5     Elbow extension   4+ (posterior shoulder pain)     Grip strength         (Blank rows = not tested)  (Key: WFL = within functional limits not formally assessed, * = concordant pain, s = stiffness/stretching sensation, NT = not tested)  Comments:      PALPATION:  Concordant tenderness L infraspinatus/teres, mild tenderness L LS, lat, deltoid, bicipital groove             TODAY'S TREATMENT: [facility] Adult PT Treatment:                                                DATE: 09/10/2022 Therapeutic Exercise: Extensively reviewed HEP and updated today  Manual Therapy: IASTM along the L upper trap / middle deltoid CT grade V with cavitation  noted Thoracic grade V with cavitation noted  Trigger Point Dry-Needling  Treatment instructions: Expect mild to moderate muscle soreness. S/S of pneumothorax if dry needled over a lung field, and to seek immediate medical attention should they occur. Patient verbalized understanding of these instructions and education.  Patient Consent Given: Yes Education handout provided: Previously provided Muscles treated: L upper Electrical stimulation performed: Yes Parameters:  for infraspinatus CPS 20 x 15 min ,  Treatment response/outcome: twitch response, reduction in symptoms.    [facility] Adult PT Treatment:                                                DATE: 09/02/22 Therapeutic Exercise: UBE L5 x 5 min ( FWD/BWD x 2:30  sec) EMOM x 2 sets Bent over barbell (verbal cues for proper form, 1st set 45# bar, 2nd set 25# bar Standing scapular protraction with foam roll with lower trap activation with RTB 2 x 10 Walking overhead 5# KB carrying with UE in 90/90  Body blade IR/ER Manual Therapy: MTPR along the posterior shoulder musculature And taught how to use at home with theracane Tack and stretch of the levator/ upper trap.   Self Care: Provided handout/ information for Tens unit infomration and pad placement.   [facility] Adult PT Treatment:                                                DATE: 08-27-22 Therapeutic Exercise: Star pattern 3 x 10 with BTB with VC for slowing down and not using momentum OH press with 15 # KB on L 2 x 10 Ball on wall  in shld elbow 90/90 position [employer] with L UE at 90/90 for 80 ft x 2 Bent over Unilateral row with L UE 2 x 10 with 25 # KB  Manual Therapy:  STW to Left periscapular muscles especially rhomboids, infraspinatus and subscapularis, Myofascial release of supscapularis   R sidelying and PT holding scapula for increased  posterior capsule stretch Trigger Point Dry-Needling performed  by Garen Lah PT Treatment instructions: Expect mild to moderate muscle soreness. S/S of pneumothorax if dry needled over a lung field, and to seek immediate medical attention should they occur. Patient verbalized understanding of these instructions and education.  Patient Consent Given: Yes Education handout provided: Previously provided Muscles treated: L shld only  subscapularis on spine side and laterally, UT, infraspinatus Electrical stimulation performed: yes  9 min Parameters: e stim  to pt tolerance at 25 ma Treatment response/outcome: twitch response noted, pt noted relief            PATIENT EDUCATION: Education details: rationale for interventions Person educated: Patient Education method: Explanation, Demonstration, Tactile cues, Verbal cues, and  Handouts Education comprehension: verbalized understanding, returned demonstration, verbal cues required, tactile cues required, and needs further education     HOME EXERCISE PROGRAM: Access Code: [email or web address] URL: [email or web address] Date: 09/10/2022 Prepared by: Lulu Riding  Program Notes - add red band around wrist for scaption exercise- provided blue band on 07/29/22 to progress resistance   Exercises - Standing Isometric Shoulder External Rotation with Doorway  - 1 x daily - 7 x weekly - 3 sets - 5 reps - Supine Shoulder Horizontal Abduction  with Resistance  - 1 x daily - 7 x weekly - 3-4 sets - 15-20 reps - Supine shoulder external rotation- KNEES BENT  - 1 x daily - 7 x weekly - 3-4 sets - 15-20 reps - Alternating star pattern  - 1 x daily - 7 x weekly - 3-4 sets - 10-20 reps - Standing Shoulder Scaption  - 1 x daily - 7 x weekly - 3-4 sets - 15-20 reps - Lower Trap Wall slides  - 1 x daily - 7 x weekly - 3-4 sets - 15-20 reps - Standing Sleeper Stretch at [employer]  - 1 x daily - 7 x weekly - 2 sets - 2 reps - 30 seconds hold - Shoulder I's  - 1 x daily - 7 x weekly - 3-4 sets - 15-20 reps - Shoulder Y's  - 1 x daily - 7 x weekly - 3-4 sets - 15-20 reps - Prone Shoulder Horizontal Abduction  - 1 x daily - 7 x weekly - 3-4 sets - 15-20 reps    ASSESSMENT:   CLINICAL IMPRESSION: Adhrith presents to physical therapy reporting 2/10 pain in the neck/ shoulder. He did note some soreness after the last session which was likely related to DOMS. He has increased his shoulder strength, and overall function despite continued pain. He improved his FOTO score to 65% today, and met or partially met all goals today. Finished session with a final round of TPDN combined with e-stim for infraspinatus and manual techniques. Based on his current LOF he is able to maintain and progress his current LOF IND and will be discharged today.     OBJECTIVE IMPAIRMENTS: decreased activity  tolerance, decreased endurance, decreased mobility, decreased ROM, decreased strength, hypomobility, impaired UE functional use, and pain.    ACTIVITY LIMITATIONS: carrying, sitting, sleeping, and reach over head   PARTICIPATION LIMITATIONS: meal prep, cleaning, and community activity   PERSONAL FACTORS: Time since onset of injury/illness/exacerbation are also affecting patient's functional outcome.    REHAB POTENTIAL: Good   CLINICAL DECISION MAKING: Stable/uncomplicated   EVALUATION COMPLEXITY: Low     GOALS: Goals reviewed with patient? No   SHORT TERM GOALS: Target date: 06/29/2022   Pt will demonstrate appropriate understanding and performance of initially prescribed HEP in order to facilitate improved independence with management of symptoms.  Baseline: HEP provided on eval 07/03/22: I and compliant Goal status: MET    2. Pt will score greater than or equal to 61% on FOTO in order to demonstrate improved perception of function due to symptoms.            Baseline: 55%  07/03/22: deferred due to pain increase   07/20/22: 55%            Goal status: MET 09/10/2022   LONG TERM GOALS: Target date: 09/02/2022 (updated 08/12/22 to accommodate remaining visits in POC) Pt will score 67% on FOTO in order to demonstrate improved perception of function due to symptoms. Baseline: 55% 07/20/22: 55% Goal status: partially MET 09/10/2022   2.  Pt will demonstrate at least 165 degrees of active L shoulder elevation in order to demonstrate improved tolerance to functional movement patterns such as reaching overhead.  Baseline: see ROM chart above 07/20/22: 161 deg flex, 160 abd 08/12/22: >/= 165 deg flex and abd Goal status: MET 08/12/22   3.  Pt will demonstrate at least 4+/5 shoulder ER/IR MMT for improved symmetry of UE strength and improved tolerance to functional movements.  Baseline: see MMT  chart above 07/20/22: 4+/5 rotational MMT but painful 08/22/22: IR without shoulder pain, ER with mild  pain Goal status: MET 09/10/2022  4. Pt will report/demonstrate ability to perform overhead reach with less than 2 point increase in pain on NPS in order to indicative improved tolerance/independence with functional tasks such as cleaning/dressing.            Baseline: up to 8/10 with daily activities  07/20/22: continues to have increase in pain w/ overhead activities, up to 6/10   08/12/22: up to 4/10 with daily activities            Goal status: MET 09/10/2022             5. Pt will report ability to sleep through the night with less than 2 interruptions due to L shoulder pain in order to facilitate improved overall health and QOL.                       Baseline: waking 2-3 times a night on average 07/20/22: pt states sleep has much improved since starting PT, wakes him up a couple times a week                      Goal status: MET     PLAN (updated 08/12/22):   PT FREQUENCY: 2x/week     PT DURATION: 3 weeks   PLANNED INTERVENTIONS: Therapeutic exercises, Therapeutic activity, Neuromuscular re-education, Balance training, Gait training, Patient/Family education, Self Care, Joint mobilization, Joint manipulation, Dry Needling, Electrical stimulation, Spinal manipulation, Spinal mobilization, Cryotherapy, Moist heat, Taping, Manual therapy, and Re-evaluation   PLAN FOR NEXT SESSION: Anticipate continued progression to more overhead strengthening, long lever GH strengthening, and promoting increased self efficacy with program     Lulu Riding PT, DPT, LAT, ATC  09/10/22  3:16 PM

## 2022-09-26 DIAGNOSIS — Z419 Encounter for procedure for purposes other than remedying health state, unspecified: Secondary | ICD-10-CM | POA: Diagnosis not present

## 2022-10-26 DIAGNOSIS — Z419 Encounter for procedure for purposes other than remedying health state, unspecified: Secondary | ICD-10-CM | POA: Diagnosis not present

## 2022-11-26 DIAGNOSIS — Z419 Encounter for procedure for purposes other than remedying health state, unspecified: Secondary | ICD-10-CM | POA: Diagnosis not present

## 2022-12-27 DIAGNOSIS — Z419 Encounter for procedure for purposes other than remedying health state, unspecified: Secondary | ICD-10-CM | POA: Diagnosis not present

## 2023-01-26 DIAGNOSIS — Z419 Encounter for procedure for purposes other than remedying health state, unspecified: Secondary | ICD-10-CM | POA: Diagnosis not present

## 2023-02-26 DIAGNOSIS — Z419 Encounter for procedure for purposes other than remedying health state, unspecified: Secondary | ICD-10-CM | POA: Diagnosis not present

## 2023-03-28 DIAGNOSIS — Z419 Encounter for procedure for purposes other than remedying health state, unspecified: Secondary | ICD-10-CM | POA: Diagnosis not present

## 2023-04-28 DIAGNOSIS — Z419 Encounter for procedure for purposes other than remedying health state, unspecified: Secondary | ICD-10-CM | POA: Diagnosis not present

## 2023-05-03 ENCOUNTER — Telehealth: Payer: Medicaid Other | Admitting: Physician Assistant

## 2023-05-03 DIAGNOSIS — J0110 Acute frontal sinusitis, unspecified: Secondary | ICD-10-CM | POA: Diagnosis not present

## 2023-05-03 MED ORDER — FLUTICASONE PROPIONATE 50 MCG/ACT NA SUSP: 2.0000 | Freq: Every day | NASAL | 0 refills | Status: AC

## 2023-05-03 MED ORDER — AMOXICILLIN-POT CLAVULANATE 875-125 MG PO TABS: 1.0000 | ORAL_TABLET | Freq: Two times a day (BID) | ORAL | 0 refills | Status: AC

## 2023-05-03 NOTE — Patient Instructions (Signed)
 Selinda LITTIE Abate, thank you for joining Kirk GORMAN Sage, PA-C for today's virtual visit.  While this provider is not your primary care provider (PCP), if your PCP is located in our provider database this encounter information will be shared with them immediately following your visit.   A [facility] MyChart account gives you access to today's visit and all your visits, tests, and labs performed at [facility]  click here if you don't have a [facility] MyChart account or go to [email or web address]  Consent: (Patient) Jonathon Harris provided verbal consent for this virtual visit at the beginning of the encounter.  Current Medications:  Current Outpatient Medications:    amoxicillin -clavulanate (AUGMENTIN ) 875-125 MG tablet, Take 1 tablet by mouth 2 (two) times daily for 7 days., Disp: 14 tablet, Rfl: 0   fluticasone  (FLONASE ) 50 MCG/ACT nasal spray, Place 2 sprays into both nostrils daily., Disp: 16 g, Rfl: 0   B Complex-C-Folic Acid (HM SUPER VITAMIN B COMPLEX/C) TABS, daily., Disp: , Rfl:    Cholecalciferol (VITAMIN D ) 2000 units tablet, Take 1 tablet (2,000 Units total) by mouth daily., Disp: 90 tablet, Rfl: 1   diphenhydrAMINE HCl, Sleep, 25 MG CAPS, daily as needed., Disp: , Rfl:    methocarbamol  (ROBAXIN ) 500 MG tablet, Take 1 tablet (500 mg total) by mouth every 8 (eight) hours as needed for muscle spasms., Disp: 21 tablet, Rfl: 0   naproxen  (NAPROSYN ) 500 MG tablet, Take 1 tablet (500 mg total) by mouth 2 (two) times daily with a meal., Disp: 28 tablet, Rfl: 0   Omega-3 1000 MG CAPS, Take 1 capsule (1,000 mg total) by mouth 2 (two) times daily after a meal., Disp: 180 capsule, Rfl: 1   Medications ordered in this encounter:  Meds ordered this encounter  Medications   amoxicillin -clavulanate (AUGMENTIN ) 875-125 MG tablet    Sig: Take 1 tablet by mouth 2 (two) times daily for 7 days.    Dispense:  14 tablet    Refill:  0    Supervising Provider:   LAMPTEY, PHILIP O  [[id number]]   fluticasone  (FLONASE ) 50 MCG/ACT nasal spray    Sig: Place 2 sprays into both nostrils daily.    Dispense:  16 g    Refill:  0    Supervising Provider:   BLAISE ALEENE KIDD [[id number]]     *If you need refills on other medications prior to your next appointment, please contact your pharmacy*  Follow-Up: Call back or seek an in-person evaluation if the symptoms worsen or if the condition fails to improve as anticipated.   Virtual Care [phone]  Other Instructions Sinus Infection, Adult A sinus infection, also called sinusitis, is inflammation of your sinuses. Sinuses are hollow spaces in the bones around your face. Your sinuses are located: Around your eyes. In the middle of your forehead. Behind your nose. In your cheekbones. Mucus normally drains out of your sinuses. When your nasal tissues become inflamed or swollen, mucus can become trapped or blocked. This allows bacteria, viruses, and fungi to grow, which leads to infection. Most infections of the sinuses are caused by a virus. A sinus infection can develop quickly. It can last for up to 4 weeks (acute) or for more than 12 weeks (chronic). A sinus infection often develops after a cold. What are the causes? This condition is caused by anything that creates swelling in the sinuses or stops mucus from draining. This includes: Allergies. Asthma. Infection from bacteria or viruses. Deformities or blockages in  your nose or sinuses. Abnormal growths in the nose (nasal polyps). Pollutants, such as chemicals or irritants in the air. Infection from fungi. This is rare. What increases the risk? You are more likely to develop this condition if you: Have a weak body defense system (immune system). Do a lot of swimming or diving. Overuse nasal sprays. Smoke. What are the signs or symptoms? The main symptoms of this condition are pain and a feeling of pressure around the affected sinuses. Other symptoms  include: Stuffy nose or congestion that makes it difficult to breathe through your nose. Thick yellow or greenish drainage from your nose. Tenderness, swelling, and warmth over the affected sinuses. A cough that may get worse at night. Decreased sense of smell and taste. Extra mucus that collects in the throat or the back of the nose (postnasal drip) causing a sore throat or bad breath. Tiredness (fatigue). Fever. How is this diagnosed? This condition is diagnosed based on: Your symptoms. Your medical history. A physical exam. Tests to find out if your condition is acute or chronic. This may include: Checking your nose for nasal polyps. Viewing your sinuses using a device that has a light (endoscope). Testing for allergies or bacteria. Imaging tests, such as an MRI or CT scan. In rare cases, a bone biopsy may be done to rule out more serious types of fungal sinus disease. How is this treated? Treatment for a sinus infection depends on the cause and whether your condition is chronic or acute. If caused by a virus, your symptoms should go away on their own within 10 days. You may be given medicines to relieve symptoms. They include: Medicines that shrink swollen nasal passages (decongestants). A spray that eases inflammation of the nostrils (topical intranasal corticosteroids). Rinses that help get rid of thick mucus in your nose (nasal saline washes). Medicines that treat allergies (antihistamines). Over-the-counter pain relievers. If caused by bacteria, your health care provider may recommend waiting to see if your symptoms improve. Most bacterial infections will get better without antibiotic medicine. You may be given antibiotics if you have: A severe infection. A weak immune system. If caused by narrow nasal passages or nasal polyps, surgery may be needed. Follow these instructions at home: Medicines Take, use, or apply over-the-counter and prescription medicines only as told by  your health care provider. These may include nasal sprays. If you were prescribed an antibiotic medicine, take it as told by your health care provider. Do not stop taking the antibiotic even if you start to feel better. Hydrate and humidify  Drink enough fluid to keep your urine pale yellow. Staying hydrated will help to thin your mucus. Use a cool mist humidifier to keep the humidity level in your home above 50%. Inhale steam for 10-15 minutes, 3-4 times a day, or as told by your health care provider. You can do this in the bathroom while a hot shower is running. Limit your exposure to cool or dry air. Rest Rest as much as possible. Sleep with your head raised (elevated). Make sure you get enough sleep each night. General instructions  Apply a warm, moist washcloth to your face 3-4 times a day or as told by your health care provider. This will help with discomfort. Use nasal saline washes as often as told by your health care provider. Wash your hands often with soap and water to reduce your exposure to germs. If soap and water are not available, use hand sanitizer. Do not smoke. Avoid being around people  who are smoking (secondhand smoke). Keep all follow-up visits. This is important. Contact a health care provider if: You have a fever. Your symptoms get worse. Your symptoms do not improve within 10 days. Get help right away if: You have a severe headache. You have persistent vomiting. You have severe pain or swelling around your face or eyes. You have vision problems. You develop confusion. Your neck is stiff. You have trouble breathing. These symptoms may be an emergency. Get help right away. Call 911. Do not wait to see if the symptoms will go away. Do not drive yourself to the hospital. Summary A sinus infection is soreness and inflammation of your sinuses. Sinuses are hollow spaces in the bones around your face. This condition is caused by nasal tissues that become inflamed  or swollen. The swelling traps or blocks the flow of mucus. This allows bacteria, viruses, and fungi to grow, which leads to infection. If you were prescribed an antibiotic medicine, take it as told by your health care provider. Do not stop taking the antibiotic even if you start to feel better. Keep all follow-up visits. This is important. This information is not intended to replace advice given to you by your health care provider. Make sure you discuss any questions you have with your health care provider. Document Revised: 03/18/2021 Document Reviewed: 03/18/2021 Elsevier Patient Education  2024 [employer]    If you have been instructed to have an in-person evaluation today at a local Urgent Care facility, please use the link below. It will take you to a list of all of our available [facility], including address, phone number and hours of operation. Please do not delay care.  [facility]  If you or a family member do not have a primary care provider, use the link below to schedule a visit and establish care. When you choose a [facility] primary care physician or advanced practice provider, you gain a long-term partner in health. Find a Primary Care Provider  Learn more about [facility] in-office and virtual care options: [facility] - Get Care Now

## 2023-05-03 NOTE — Progress Notes (Signed)
 Virtual Visit Consent   Jonathon Harris, you are scheduled for a virtual visit with a [facility] provider today. Just as with appointments in the office, your consent must be obtained to participate. Your consent will be active for this visit and any virtual visit you may have with one of our providers in the next 365 days. If you have a MyChart account, a copy of this consent can be sent to you electronically.  As this is a virtual visit, video technology does not allow for your provider to perform a traditional examination. This may limit your provider's ability to fully assess your condition. If your provider identifies any concerns that need to be evaluated in person or the need to arrange testing (such as labs, EKG, etc.), we will make arrangements to do so. Although advances in technology are sophisticated, we cannot ensure that it will always work on either your end or our end. If the connection with a video visit is poor, the visit may have to be switched to a telephone visit. With either a video or telephone visit, we are not always able to ensure that we have a secure connection.  By engaging in this virtual visit, you consent to the provision of healthcare and authorize for your insurance to be billed (if applicable) for the services provided during this visit. Depending on your insurance coverage, you may receive a charge related to this service.  I need to obtain your verbal consent now. Are you willing to proceed with your visit today? Jonathon AUMENT has provided verbal consent on 05/03/2023 for a virtual visit (video or telephone). Jonathon GORMAN Sage, Jonathon Harris  Date: 05/03/2023 5:27 PM  Virtual Visit via Video Note   I, Jonathon Harris, connected with  Jonathon Harris  ([id number], August 02, 1975) on 05/03/23 at  5:15 PM EST by a video-enabled telemedicine application and verified that I am speaking with the correct person using two identifiers.  Location: Patient: Virtual Visit Location Patient:  Home Provider: Virtual Visit Location Provider: Home Office   I discussed the limitations of evaluation and management by telemedicine and the availability of in person appointments. The patient expressed understanding and agreed to proceed.    History of Present Illness: Jonathon Harris is a 48 y.o. who identifies as a male who was assigned male at birth, and has been experiencing symptoms for approximately a month. The initial symptoms were flu-like, characterized by fatigue, which then progressed to a severe cough. The cough was productive, with the patient describing the sputum as neon green. The frequency of the cough has since decreased, but the patient continues to experience nasal congestion and discolored nasal discharge.  The patient also reports a sensation of ear fullness, describing it as feeling stopped up, leading to muffled hearing. The patient has been in contact with a sick individual, specifically her granddaughter, who was sent home from school due to illness.  To manage the symptoms, the patient has been taking vitamins, Alka-Seltzer plus cold medicine, and Nyquil. These medications have provided some relief, particularly aiding in sleep, but the patient still reports persistent nasal congestion. Eating and drinking okay.   Problems:  Patient Active Problem List   Diagnosis Date Noted   Pseudopolyposis of colon without complication, unspecified part of colon (HCC) 05/08/2022   Radiculopathy 02/07/2021   Benign colon polyp 08/02/2020   Low TSH level 03/15/2020   Prediabetes 02/09/2020   Family hx of colon cancer 02/01/2020   History of tobacco use  02/01/2020   Hyperlipidemia 05/12/2016   Chronic bilateral back pain 02/10/2016    Allergies: No Known Allergies Medications:  Current Outpatient Medications:    amoxicillin -clavulanate (AUGMENTIN ) 875-125 MG tablet, Take 1 tablet by mouth 2 (two) times daily for 7 days., Disp: 14 tablet, Rfl: 0   fluticasone  (FLONASE ) 50  MCG/ACT nasal spray, Place 2 sprays into both nostrils daily., Disp: 16 g, Rfl: 0   B Complex-C-Folic Acid (HM SUPER VITAMIN B COMPLEX/C) TABS, daily., Disp: , Rfl:    Cholecalciferol (VITAMIN D ) 2000 units tablet, Take 1 tablet (2,000 Units total) by mouth daily., Disp: 90 tablet, Rfl: 1   diphenhydrAMINE HCl, Sleep, 25 MG CAPS, daily as needed., Disp: , Rfl:    methocarbamol  (ROBAXIN ) 500 MG tablet, Take 1 tablet (500 mg total) by mouth every 8 (eight) hours as needed for muscle spasms., Disp: 21 tablet, Rfl: 0   naproxen  (NAPROSYN ) 500 MG tablet, Take 1 tablet (500 mg total) by mouth 2 (two) times daily with a meal., Disp: 28 tablet, Rfl: 0   Omega-3 1000 MG CAPS, Take 1 capsule (1,000 mg total) by mouth 2 (two) times daily after a meal., Disp: 180 capsule, Rfl: 1  Observations/Objective: Patient is well-developed, well-nourished in no acute distress.  Resting comfortably  at home.  Head is normocephalic, atraumatic.  No labored breathing.  Speech is clear and coherent with logical content.  Patient is alert and oriented at baseline.    Assessment and Plan: 1. Acute non-recurrent frontal sinusitis (Primary) - amoxicillin -clavulanate (AUGMENTIN ) 875-125 MG tablet; Take 1 tablet by mouth 2 (two) times daily for 7 days.  Dispense: 14 tablet; Refill: 0 - fluticasone  (FLONASE ) 50 MCG/ACT nasal spray; Place 2 sprays into both nostrils daily.  Dispense: 16 g; Refill: 0  -Start Augmentin  for suspected bacterial sinusitis. -Add nasal steroid to reduce inflammation and improve symptoms.   Follow Up Instructions: I discussed the assessment and treatment plan with the patient. The patient was provided an opportunity to ask questions and all were answered. The patient agreed with the plan and demonstrated an understanding of the instructions.  A copy of instructions were sent to the patient via MyChart unless otherwise noted below.     The patient was advised to call back or seek an in-person  evaluation if the symptoms worsen or if the condition fails to improve as anticipated.    Jonathon RAMAN Mayers, Jonathon Harris

## 2023-05-10 ENCOUNTER — Encounter: Payer: Medicaid Other | Admitting: Nurse Practitioner

## 2023-05-29 DIAGNOSIS — Z419 Encounter for procedure for purposes other than remedying health state, unspecified: Secondary | ICD-10-CM | POA: Diagnosis not present

## 2023-06-08 ENCOUNTER — Ambulatory Visit: Payer: Medicaid Other | Admitting: Nurse Practitioner

## 2023-06-08 VITALS — BP 112/78 | HR 68 | Temp 98.3°F | Resp 18 | Ht 69.0 in | Wt 169.6 lb

## 2023-06-08 DIAGNOSIS — R7303 Prediabetes: Secondary | ICD-10-CM

## 2023-06-08 DIAGNOSIS — Z860100 Personal history of colon polyps, unspecified: Secondary | ICD-10-CM | POA: Diagnosis not present

## 2023-06-08 DIAGNOSIS — E0590 Thyrotoxicosis, unspecified without thyrotoxic crisis or storm: Secondary | ICD-10-CM | POA: Diagnosis not present

## 2023-06-08 DIAGNOSIS — E782 Mixed hyperlipidemia: Secondary | ICD-10-CM

## 2023-06-08 DIAGNOSIS — Z0001 Encounter for general adult medical examination with abnormal findings: Secondary | ICD-10-CM

## 2023-06-08 LAB — HEMOGLOBIN A1C: Hgb A1c MFr Bld: 6.1 % (ref 4.6–6.5)

## 2023-06-08 NOTE — Assessment & Plan Note (Signed)
>>  ASSESSMENT AND PLAN FOR HISTORY OF COLON POLYPS WRITTEN ON 06/08/2023  2:54 PM BY Erina Hamme LUM, NP  Repeat colonoscopy 2027

## 2023-06-08 NOTE — Assessment & Plan Note (Signed)
Repeat colonoscopy 2027

## 2023-06-08 NOTE — Assessment & Plan Note (Signed)
Repeat lipid panel ?

## 2023-06-08 NOTE — Patient Instructions (Signed)
Go to lab Maintain Heart healthy diet and daily exercise.  Preventive Care 48 Years Old, Male Preventive care refers to lifestyle choices and visits with your health care provider that can promote health and wellness. Preventive care visits are also called wellness exams. What can I expect for my preventive care visit? Counseling During your preventive care visit, your health care provider may ask about your: Medical history, including: Past medical problems. Family medical history. Current health, including: Emotional well-being. Home life and relationship well-being. Sexual activity. Lifestyle, including: Alcohol, nicotine or tobacco, and drug use. Access to firearms. Diet, exercise, and sleep habits. Safety issues such as seatbelt and bike helmet use. Sunscreen use. Work and work Astronomer. Physical exam Your health care provider will check your: Height and weight. These may be used to calculate your BMI (body mass index). BMI is a measurement that tells if you are at a healthy weight. Waist circumference. This measures the distance around your waistline. This measurement also tells if you are at a healthy weight and may help predict your risk of certain diseases, such as type 2 diabetes and high blood pressure. Heart rate and blood pressure. Body temperature. Skin for abnormal spots. What immunizations do I need?  Vaccines are usually given at various ages, according to a schedule. Your health care provider will recommend vaccines for you based on your age, medical history, and lifestyle or other factors, such as travel or where you work. What tests do I need? Screening Your health care provider may recommend screening tests for certain conditions. This may include: Lipid and cholesterol levels. Diabetes screening. This is done by checking your blood sugar (glucose) after you have not eaten for a while (fasting). Hepatitis B test. Hepatitis C test. HIV (human  immunodeficiency virus) test. STI (sexually transmitted infection) testing, if you are at risk. Lung cancer screening. Prostate cancer screening. Colorectal cancer screening. Talk with your health care provider about your test results, treatment options, and if necessary, the need for more tests. Follow these instructions at home: Eating and drinking  Eat a diet that includes fresh fruits and vegetables, whole grains, lean protein, and low-fat dairy products. Take vitamin and mineral supplements as recommended by your health care provider. Do not drink alcohol if your health care provider tells you not to drink. If you drink alcohol: Limit how much you have to 0-2 drinks a day. Know how much alcohol is in your drink. In the [place] one drink equals one 12 oz bottle of beer (355 mL), one 5 oz glass of wine (148 mL), or one 1 oz glass of hard liquor (44 mL). Lifestyle Brush your teeth every morning and night with fluoride toothpaste. Floss one time each day. Exercise for at least 30 minutes 5 or more days each week. Do not use any products that contain nicotine or tobacco. These products include cigarettes, chewing tobacco, and vaping devices, such as e-cigarettes. If you need help quitting, ask your health care provider. Do not use drugs. If you are sexually active, practice safe sex. Use a condom or other form of protection to prevent STIs. Take aspirin only as told by your health care provider. Make sure that you understand how much to take and what form to take. Work with your health care provider to find out whether it is safe and beneficial for you to take aspirin daily. Find healthy ways to manage stress, such as: Meditation, yoga, or listening to music. Journaling. Talking to a trusted person. Spending time  with friends and family. Minimize exposure to UV radiation to reduce your risk of skin cancer. Safety Always wear your seat belt while driving or riding in a vehicle. Do not  drive: If you have been drinking alcohol. Do not ride with someone who has been drinking. When you are tired or distracted. While texting. If you have been using any mind-altering substances or drugs. Wear a helmet and other protective equipment during sports activities. If you have firearms in your house, make sure you follow all gun safety procedures. What's next? Go to your health care provider once a year for an annual wellness visit. Ask your health care provider how often you should have your eyes and teeth checked. Stay up to date on all vaccines. This information is not intended to replace advice given to you by your health care provider. Make sure you discuss any questions you have with your health care provider. Document Revised: 10/09/2020 Document Reviewed: 10/09/2020 Elsevier Patient Education  2024 [employer].

## 2023-06-08 NOTE — Assessment & Plan Note (Signed)
Asymptomatic Weight loss noted is intentional through diet modification Repeat Tsh and T4 Wt Readings from Last 3 Encounters:  06/08/23 169 lb 9.6 oz (76.9 kg)  05/12/22 176 lb (79.8 kg)  05/08/22 175 lb 6.4 oz (79.6 kg)

## 2023-06-08 NOTE — Progress Notes (Signed)
Complete physical exam  Patient: Jonathon Harris   DOB: 22-Sep-1975   48 y.o. Male  MRN: [id number] Visit Date: 06/08/2023  Subjective:    Chief Complaint  Patient presents with   Annual Exam   Jonathon Harris is a 48 y.o. male who presents today for a complete physical exam. He reports consuming a low fat diet. Home exercise routine includes calisthenics. He generally feels well. He reports sleeping well. He does not have additional problems to discuss today.  Vision:No Dental:Yes STD Screen:No PSA:Yes  BP Readings from Last 3 Encounters:  06/08/23 112/78  05/12/22 112/72  05/08/22 122/82   Wt Readings from Last 3 Encounters:  06/08/23 169 lb 9.6 oz (76.9 kg)  05/12/22 176 lb (79.8 kg)  05/08/22 175 lb 6.4 oz (79.6 kg)   Most recent fall risk assessment:    06/08/2023    1:06 PM  Fall Risk   Falls in the past year? 0  Number falls in past yr: 0  Injury with Fall? 0  Risk for fall due to : No Fall Risks  Follow up Falls evaluation completed   Depression screen:Yes - No Depression  Most recent depression screenings:    06/08/2023    1:07 PM 05/08/2022   11:23 AM  PHQ 2/9 Scores  PHQ - 2 Score 3 1  PHQ- 9 Score 8 6    HPI  Benign colon polyp  >>ASSESSMENT AND PLAN FOR HISTORY OF COLON POLYPS WRITTEN ON 06/08/2023  2:54 PM BY Ketan Renz LUM, NP  Repeat colonoscopy 2027  Subclinical hyperthyroidism Asymptomatic Weight loss noted is intentional through diet modification Repeat Tsh and T4 Wt Readings from Last 3 Encounters:  06/08/23 169 lb 9.6 oz (76.9 kg)  05/12/22 176 lb (79.8 kg)  05/08/22 175 lb 6.4 oz (79.6 kg)     Prediabetes Repeat hgbA1c  Hyperlipidemia Repeat lipid panel   Past Medical History:  Diagnosis Date   Elevated BP without diagnosis of hypertension 05/14/2016   Thyroid disease    hyperactive- no medications at this time   Past Surgical History:  Procedure Laterality Date   CYST EXCISION     chest   Social History    Socioeconomic History   Marital status: Married    Spouse name: Not on file   Number of children: 4   Years of education: Not on file   Highest education level: Bachelor's degree (e.g., BA, AB, BS)  Occupational History   Not on file  Tobacco Use   Smoking status: Former    Current packs/day: 0.25    Types: Cigarettes   Smokeless tobacco: Former    Quit date: 02/02/2020  Vaping Use   Vaping status: Former   Substances: Nicotine, Nicotine-salt  Substance and Sexual Activity   Alcohol use: Yes    Alcohol/week: 1.0 standard drink of alcohol    Types: 1 Shots of liquor per week   Drug use: Yes    Frequency: 2.0 times per week    Types: Marijuana    Comment: uses several times a week   Sexual activity: Yes    Birth control/protection: None  Other Topics Concern   Not on file  Social History Narrative   Not on file   Social Drivers of Health   Financial Resource Strain: High Risk (06/08/2023)   Overall Financial Resource Strain (CARDIA)    Difficulty of Paying Living Expenses: Hard  Food Insecurity: Food Insecurity Present (06/08/2023)   Hunger Vital Sign  Worried About Programme researcher, broadcasting/film/video in the Last Year: Sometimes true    [employer] of Food in the Last Year: Never true  Transportation Needs: No Transportation Needs (06/08/2023)   PRAPARE - Administrator, Civil Service (Medical): No    Lack of Transportation (Non-Medical): No  Physical Activity: Insufficiently Active (06/08/2023)   Exercise Vital Sign    Days of Exercise per Week: 3 days    Minutes of Exercise per Session: 30 min  Stress: Stress Concern Present (06/08/2023)   [employer] of Occupational Health - Occupational Stress Questionnaire    Feeling of Stress : Very much  Social Connections: Moderately Integrated (06/08/2023)   Social Connection and Isolation Panel [NHANES]    Frequency of Communication with Friends and Family: More than three times a week    Frequency of Social Gatherings with  Friends and Family: Twice a week    Attends Religious Services: 1 to 4 times per year    Active Member of [employer] or Organizations: No    Attends Engineer, structural: Not on file    Marital Status: Married  Catering manager Violence: Not on file   Family Status  Relation Name Status   Father  Deceased       deceased at 2   Brother  (Not Specified)   Youth worker  (Not Specified)       lung and breast   Mat Uncle  (Not Specified)       bone cancer   MGM  (Not Specified)   MGF  (Not Specified)       prostate cancer, diagnosed in 40s   Mother  Alive   Neg Hx  (Not Specified)  No partnership data on file   Family History  Problem Relation Age of Onset   Hypertension Father    Cerebral aneurysm Father    Heart disease Father    Stroke Brother    Heart disease Brother    Cancer Maternal Aunt    Cancer Maternal Uncle    Asthma Maternal Grandmother    Asthma Maternal Grandfather    Cancer Mother 80       breast and colon cancer   Colon cancer Mother    Thyroid disease Neg Hx    Esophageal cancer Neg Hx    Stomach cancer Neg Hx    Rectal cancer Neg Hx    No Known Allergies  Patient Care Team: Wacey Zieger, Bonna Gains, NP as PCP - General (Internal Medicine)   Medications: Outpatient Medications Prior to Visit  Medication Sig   B Complex-C-Folic Acid (HM SUPER VITAMIN B COMPLEX/C) TABS daily.   Cholecalciferol (VITAMIN D) 2000 units tablet Take 1 tablet (2,000 Units total) by mouth daily.   diphenhydrAMINE HCl, Sleep, 25 MG CAPS daily as needed.   fluticasone (FLONASE) 50 MCG/ACT nasal spray Place 2 sprays into both nostrils daily.   Omega-3 1000 MG CAPS Take 1 capsule (1,000 mg total) by mouth 2 (two) times daily after a meal.   [DISCONTINUED] methocarbamol (ROBAXIN) 500 MG tablet Take 1 tablet (500 mg total) by mouth every 8 (eight) hours as needed for muscle spasms.   [DISCONTINUED] naproxen (NAPROSYN) 500 MG tablet Take 1 tablet (500 mg total) by mouth 2 (two) times  daily with a meal.   No facility-administered medications prior to visit.   Review of Systems  Constitutional:  Negative for activity change, appetite change and unexpected weight change.  Respiratory: Negative.    Cardiovascular:  Negative.   Gastrointestinal: Negative.   Endocrine: Negative for cold intolerance and heat intolerance.  Genitourinary: Negative.   Musculoskeletal: Negative.   Skin: Negative.   Neurological: Negative.   Hematological: Negative.   Psychiatric/Behavioral:  Negative for behavioral problems, decreased concentration, dysphoric mood, hallucinations, self-injury, sleep disturbance and suicidal ideas. The patient is not nervous/anxious.        Objective:  BP 112/78 (BP Location: Left Arm, Patient Position: Sitting, Cuff Size: Large)   Pulse 68   Temp 98.3 F (36.8 C) (Temporal)   Resp 18   Ht 5\' 9"  (1.753 m)   Wt 169 lb 9.6 oz (76.9 kg)   SpO2 100%   BMI 25.05 kg/m     Physical Exam Vitals and nursing note reviewed.  Constitutional:      General: He is not in acute distress. HENT:     Right Ear: Tympanic membrane, ear canal and external ear normal.     Left Ear: Tympanic membrane, ear canal and external ear normal.     Nose: Nose normal.  Eyes:     Extraocular Movements: Extraocular movements intact.     Conjunctiva/sclera: Conjunctivae normal.     Pupils: Pupils are equal, round, and reactive to light.  Neck:     Thyroid: No thyroid mass, thyromegaly or thyroid tenderness.  Cardiovascular:     Rate and Rhythm: Normal rate and regular rhythm.     Pulses: Normal pulses.     Heart sounds: Normal heart sounds.  Pulmonary:     Effort: Pulmonary effort is normal.     Breath sounds: Normal breath sounds.  Abdominal:     General: Bowel sounds are normal.     Palpations: Abdomen is soft.  Musculoskeletal:        General: Normal range of motion.     Cervical back: Normal range of motion and neck supple.     Right lower leg: No edema.     Left  lower leg: No edema.  Lymphadenopathy:     Cervical: No cervical adenopathy.  Skin:    General: Skin is warm and dry.  Neurological:     Mental Status: He is alert and oriented to person, place, and time.     Cranial Nerves: No cranial nerve deficit.  Psychiatric:        Mood and Affect: Mood normal.        Behavior: Behavior normal.        Thought Content: Thought content normal.      No results found for any visits on 06/08/23.    Assessment & Plan:    Routine Health Maintenance and Physical Exam  Immunization History  Administered Date(s) Administered   Tdap 02/10/2016   Health Maintenance  Topic Date Due   COVID-19 Vaccine (1 - 2024-25 season) 06/24/2023 (Originally 12/27/2022)   INFLUENZA VACCINE  07/26/2023 (Originally 11/26/2022)   Hepatitis C Screening  06/07/2024 (Originally 10/10/1992)   HIV Screening  06/07/2024 (Originally 10/10/1989)   Colonoscopy  05/28/2025   DTaP/Tdap/Td (2 - Td or Tdap) 02/09/2026   HPV VACCINES  Aged Out   Discussed health benefits of physical activity, and encouraged him to engage in regular exercise appropriate for his age and condition.  Problem List Items Addressed This Visit     Hyperlipidemia   Repeat lipid panel      Relevant Orders   Lipid panel   Prediabetes   Repeat hgbA1c      Relevant Orders   Hemoglobin A1c  Subclinical hyperthyroidism   Asymptomatic Weight loss noted is intentional through diet modification Repeat Tsh and T4 Wt Readings from Last 3 Encounters:  06/08/23 169 lb 9.6 oz (76.9 kg)  05/12/22 176 lb (79.8 kg)  05/08/22 175 lb 6.4 oz (79.6 kg)         Relevant Orders   TSH   T4, free   Other Visit Diagnoses       Encounter for preventative adult health care exam with abnormal findings    -  Primary   Relevant Orders   Comprehensive metabolic panel     History of colon polyps   (Chronic)        Return in about 1 year (around 06/07/2024) for CPE (fasting).     Alysia Penna, NP

## 2023-06-08 NOTE — Assessment & Plan Note (Signed)
Repeat hgbA1c

## 2023-06-09 ENCOUNTER — Encounter: Payer: Self-pay | Admitting: Nurse Practitioner

## 2023-06-09 LAB — COMPREHENSIVE METABOLIC PANEL
ALT: 15 U/L (ref 0–53)
AST: 15 U/L (ref 0–37)
Albumin: 4.3 g/dL (ref 3.5–5.2)
Alkaline Phosphatase: 78 U/L (ref 39–117)
BUN: 11 mg/dL (ref 6–23)
CO2: 27 meq/L (ref 19–32)
Calcium: 9 mg/dL (ref 8.4–10.5)
Chloride: 103 meq/L (ref 96–112)
Creatinine, Ser: 1.01 mg/dL (ref 0.40–1.50)
GFR: 87.85 mL/min (ref >60.00)
Glucose, Bld: 105 mg/dL — ABNORMAL HIGH (ref 70–99)
Potassium: 4.3 meq/L (ref 3.5–5.1)
Sodium: 138 meq/L (ref 135–145)
Total Bilirubin: 0.7 mg/dL (ref 0.2–1.2)
Total Protein: 7 g/dL (ref 6.0–8.3)

## 2023-06-09 LAB — LIPID PANEL
Cholesterol: 243 mg/dL — ABNORMAL HIGH (ref 0–200)
HDL: 36.7 mg/dL — ABNORMAL LOW (ref >39.00)
LDL Cholesterol: 175 mg/dL — ABNORMAL HIGH (ref 0–99)
NonHDL: 206.46
Total CHOL/HDL Ratio: 7
Triglycerides: 157 mg/dL — ABNORMAL HIGH (ref 0.0–149.0)
VLDL: 31.4 mg/dL (ref 0.0–40.0)

## 2023-06-09 LAB — T4, FREE: Free T4: 0.9 ng/dL (ref 0.60–1.60)

## 2023-06-09 LAB — TSH: TSH: 0.32 u[IU]/mL — ABNORMAL LOW (ref 0.35–5.50)

## 2023-06-09 NOTE — Addendum Note (Signed)
Addended by: Michaela Corner on: 06/09/2023 03:36 PM   Modules accepted: Orders

## 2023-06-26 DIAGNOSIS — Z419 Encounter for procedure for purposes other than remedying health state, unspecified: Secondary | ICD-10-CM | POA: Diagnosis not present

## 2023-07-14 ENCOUNTER — Emergency Department (HOSPITAL_COMMUNITY)
Admission: EM | Admit: 2023-07-14 | Discharge: 2023-07-14 | Disposition: A | Discharge status: Home / Self Care | Attending: Emergency Medicine | Admitting: Emergency Medicine

## 2023-07-14 ENCOUNTER — Emergency Department (HOSPITAL_COMMUNITY)

## 2023-07-14 ENCOUNTER — Other Ambulatory Visit: Payer: Self-pay

## 2023-07-14 ENCOUNTER — Ambulatory Visit: Payer: Self-pay | Admitting: Nurse Practitioner

## 2023-07-14 DIAGNOSIS — R935 Abnormal findings on diagnostic imaging of other abdominal regions, including retroperitoneum: Secondary | ICD-10-CM | POA: Diagnosis not present

## 2023-07-14 DIAGNOSIS — K921 Melena: Secondary | ICD-10-CM | POA: Diagnosis not present

## 2023-07-14 DIAGNOSIS — K769 Liver disease, unspecified: Secondary | ICD-10-CM

## 2023-07-14 DIAGNOSIS — K760 Fatty (change of) liver, not elsewhere classified: Secondary | ICD-10-CM | POA: Diagnosis not present

## 2023-07-14 DIAGNOSIS — K644 Residual hemorrhoidal skin tags: Secondary | ICD-10-CM | POA: Insufficient documentation

## 2023-07-14 DIAGNOSIS — K922 Gastrointestinal hemorrhage, unspecified: Secondary | ICD-10-CM | POA: Insufficient documentation

## 2023-07-14 DIAGNOSIS — R1012 Left upper quadrant pain: Secondary | ICD-10-CM | POA: Diagnosis not present

## 2023-07-14 LAB — CBC WITH DIFFERENTIAL/PLATELET
Abs Immature Granulocytes: 0.02 10*3/uL (ref 0.00–0.07)
Basophils Absolute: 0.1 10*3/uL (ref 0.0–0.1)
Basophils Relative: 1 %
Eosinophils Absolute: 0.2 10*3/uL (ref 0.0–0.5)
Eosinophils Relative: 2 %
HCT: 45.4 % (ref 39.0–52.0)
Hemoglobin: 14.5 g/dL (ref 13.0–17.0)
Immature Granulocytes: 0 %
Lymphocytes Relative: 63 %
Lymphs Abs: 5.3 10*3/uL — ABNORMAL HIGH (ref 0.7–4.0)
MCH: 28.9 pg (ref 26.0–34.0)
MCHC: 31.9 g/dL (ref 30.0–36.0)
MCV: 90.6 fL (ref 80.0–100.0)
Monocytes Absolute: 0.8 10*3/uL (ref 0.1–1.0)
Monocytes Relative: 10 %
Neutro Abs: 2 10*3/uL (ref 1.7–7.7)
Neutrophils Relative %: 24 %
Platelets: 235 10*3/uL (ref 150–400)
RBC: 5.01 MIL/uL (ref 4.22–5.81)
RDW: 15.6 % — ABNORMAL HIGH (ref 11.5–15.5)
WBC: 8.3 10*3/uL (ref 4.0–10.5)
nRBC: 0 % (ref 0.0–0.2)

## 2023-07-14 LAB — LIPASE, BLOOD: Lipase: 42 U/L (ref 11–51)

## 2023-07-14 LAB — COMPREHENSIVE METABOLIC PANEL
ALT: 13 U/L (ref 0–44)
AST: 15 U/L (ref 15–41)
Albumin: 4.1 g/dL (ref 3.5–5.0)
Alkaline Phosphatase: 72 U/L (ref 38–126)
Anion gap: 10 (ref 5–15)
BUN: 18 mg/dL (ref 6–20)
CO2: 23 mmol/L (ref 22–32)
Calcium: 9.1 mg/dL (ref 8.9–10.3)
Chloride: 102 mmol/L (ref 98–111)
Creatinine, Ser: 1.12 mg/dL (ref 0.61–1.24)
GFR, Estimated: >60 mL/min (ref >60)
Glucose, Bld: 108 mg/dL — ABNORMAL HIGH (ref 70–99)
Potassium: 3.7 mmol/L (ref 3.5–5.1)
Sodium: 135 mmol/L (ref 135–145)
Total Bilirubin: 0.4 mg/dL (ref 0.0–1.2)
Total Protein: 7.4 g/dL (ref 6.5–8.1)

## 2023-07-14 LAB — POC OCCULT BLOOD, ED: Fecal Occult Bld: NEGATIVE

## 2023-07-14 MED ORDER — IOHEXOL 300 MG/ML  SOLN
100.0000 mL | Freq: Once | INTRAMUSCULAR | Status: AC | PRN
  Administered 2023-07-14: 100 mL via INTRAVENOUS

## 2023-07-14 NOTE — ED Notes (Signed)
 Reviewed D/C information with the patient & support person, pt verbalized understanding. No additional concerns at this time.

## 2023-07-14 NOTE — Telephone Encounter (Signed)
 Chief Complaint: abdominal pain, rectal bleeding Symptoms: dull intermittent upper left abdominal pain (sometimes radiates to the right), dark blood in stool about a cup, bright red blood in stool about half a cup Frequency: occurred twice over the past couple of weeks Pertinent Negatives: Patient denies nausea, vomiting, fever, dizziness/lightheaded. Disposition: [x] ED /[] Urgent Care (no appt availability in office) / [] Appointment(In office/virtual)/ []  [place]/ [] Home Care/ [] Refused Recommended Disposition /[] [facility]/ []  Follow-up with PCP Additional Notes: Patient states a couple of weeks ago he noticed dark blood in his stool with no abdominal pain. He states a few days ago he had bright red blood in his stool and since he has had a dull left upper abdominal pain intermittently. Patient states the first time he was not as concerned because he had been eating beet powder. Patient has difficulty answering if the pain has been constant and for what duration, asked multiple times and patient states "well I wouldn't say pain but there is a constant feeling that something is off and that lasts over 2 hours." Advised patient that answering questions as accurately as possible helps get him the most appropriate level of care. Advised ED and patient asking if the urgent care with imaging would work, advised ED and explained urgent care would not fall under Emergency Services.  Patient agreeable to go to ED.  Copied from CRM [id number]. Topic: Clinical - Red Word Triage >> Jul 14, 2023  8:44 AM Elizebeth Brooking wrote: [facility] that prompted transfer to Nurse Triage: Patient called in stated he recently had blood in his stool, and now has a dull pain in his upper abdomen area Reason for Disposition  [1] Constant abdominal pain AND [2] present > 2 hours  Answer Assessment - Initial Assessment Questions 1. APPEARANCE of BLOOD: "What color is it?" "Is it passed separately, on the surface  of the stool, or mixed in with the stool?"      First time it was dark, almost black blood (noticed when he wiped and said it was in the water, looser stool). Second time was bright red.  2. AMOUNT: "How much blood was passed?"      He states the first time was a half a cup to a cup and the second time was less, maybe a half a cup.  3. FREQUENCY: "How many times has blood been passed with the stools?"      Twice.  4. ONSET: "When was the blood first seen in the stools?" (Days or weeks)      Couple of weeks ago.  5. DIARRHEA: "Is there also some diarrhea?" If Yes, ask: "How many diarrhea stools in the past 24 hours?"      Yes, states he has had it a couple times in the past week. One episode of diarrhea in the last 24 hours.  6. CONSTIPATION: "Do you have constipation?" If Yes, ask: "How bad is it?"     Yes, "not severe like I went days without using the bathroom". Patient states he has had to strain to use the bathroom more.  7. RECURRENT SYMPTOMS: "Have you had blood in your stools before?" If Yes, ask: "When was the last time?" and "What happened that time?"      He states about 6-8 months ago he thinks he had a bowel movement with blood but did not seek any medical care.  8. BLOOD THINNERS: "Do you take any blood thinners?" (e.g., Coumadin/warfarin, Pradaxa/dabigatran, aspirin)  Denies.  9. OTHER SYMPTOMS: "Do you have any other symptoms?"  (e.g., abdomen pain, vomiting, dizziness, fever)     Dull, intermittent upper left abdominal pain; headaches.  10. PREGNANCY: "Is there any chance you are pregnant?" "When was your last menstrual period?"       N/A.  Protocols used: Rectal Bleeding-A-AH

## 2023-07-14 NOTE — Telephone Encounter (Signed)
 Noted.

## 2023-07-14 NOTE — Discharge Instructions (Signed)
 You have been evaluated for your symptoms.  Fortunately no active GI bleeding at this time however it is worthwhile of to follow-up closely with a gastroenterologist for outpatient evaluation.  You may benefit from colonoscopy if is appropriate.  Incidentally there is a 1.5 cm lesion noted in your liver.  Please discuss this with your primary care doctor and request for an abdominal MRI with and without contrast for further evaluation.  This is likely a benign lesion but it is worthwhile to have it checked.  Return if you have persistent rectal bleeding or if you have worsening abdominal pain.

## 2023-07-14 NOTE — ED Triage Notes (Signed)
 Pt c/o LUQ pain described as dull and intermittent, onset of last week and 3 days ago having bright red blood in toilet bowl after BM. Denies N/V, intermittent diarrheal episodes NAD noted

## 2023-07-14 NOTE — ED Provider Notes (Signed)
 [place] EMERGENCY DEPARTMENT AT [facility] Provider Note   CSN: [id number] Arrival date & time: 07/14/23  1213     History  Chief Complaint  Patient presents with   Abdominal Pain    Blood in stool    Jonathon Harris is a 48 y.o. male.  The history is provided by the patient and medical records. No language interpreter was used.  Abdominal Pain    48 year old male history of prediabetes, chronic back pain, hyperlipidemia, thyroid disease presenting with complaint of abdominal pain.  Patient states for the past several weeks he has had recurrent abdominal discomfort.  He described pain as a crampy  sensation that started in his left upper abdomen that sometimes goes across his abdomen.  Pain happens sporadically.  He also noticed intermittent episodes of rectal bleeding.  States initially he noticed darker blood but for the past few times he noticed bright red blood per rectum.  He endorsed some diarrhea but no constipation denies straining denies any rectal discomfort no fever or chills no chest pain or shortness of breath.  Denies heavy alcohol use and denies regular NSAID use.  No postprandial pain.  He is not on any blood thinner medication.  He mention he had a colonoscopy 2 years ago with multiple polyps but was benign.  Endorse family history of colon cancer.  Home Medications Prior to Admission medications   Medication Sig Start Date End Date Taking? Authorizing Provider  B Complex-C-Folic Acid (HM SUPER VITAMIN B COMPLEX/C) TABS daily. 03/12/20   [provider]  Cholecalciferol (VITAMIN D) 2000 units tablet Take 1 tablet (2,000 Units total) by mouth daily. 11/09/16   Nche, Bonna Gains, NP  diphenhydrAMINE HCl, Sleep, 25 MG CAPS daily as needed. 03/12/20   [provider]  fluticasone (FLONASE) 50 MCG/ACT nasal spray Place 2 sprays into both nostrils daily. 05/03/23   Mayers, Cari S, PA-C  Omega-3 1000 MG CAPS Take 1 capsule (1,000 mg total) by  mouth 2 (two) times daily after a meal. 05/12/16   Nche, Bonna Gains, NP      Allergies    Patient has no known allergies.    Review of Systems   Review of Systems  Gastrointestinal:  Positive for abdominal pain.  All other systems reviewed and are negative.   Physical Exam Updated Vital Signs BP (!) 119/90 (BP Location: Left Arm)   Pulse 79   Temp 98.1 F (36.7 C) (Oral)   Resp 18   Ht 5\' 9"  (1.753 m)   Wt 77.1 kg   SpO2 97%   BMI 25.10 kg/m  Physical Exam Constitutional:      General: He is not in acute distress.    Appearance: He is well-developed.  HENT:     Head: Atraumatic.  Eyes:     Conjunctiva/sclera: Conjunctivae normal.  Cardiovascular:     Rate and Rhythm: Normal rate and regular rhythm.     Heart sounds: Normal heart sounds.  Pulmonary:     Effort: Pulmonary effort is normal.     Breath sounds: Normal breath sounds. No wheezing, rhonchi or rales.  Abdominal:     General: Bowel sounds are normal.     Palpations: Abdomen is soft.     Tenderness: There is no abdominal tenderness. There is no right CVA tenderness, left CVA tenderness, guarding or rebound.  Genitourinary:    Comments: Chaperone present during exam.  Normal rectal tone no obvious mass normal color stool on glove no anal  fissure and normal prostate.  Nonthrombosed external hemorrhoid noted Musculoskeletal:     Cervical back: Normal range of motion and neck supple.  Skin:    Findings: No rash.  Neurological:     Mental Status: He is alert.     ED Results / Procedures / Treatments   Labs (all labs ordered are listed, but only abnormal results are displayed) Labs Reviewed  CBC WITH DIFFERENTIAL/PLATELET - Abnormal; Notable for the following components:      Result Value   RDW 15.6 (*)    Lymphs Abs 5.3 (*)    All other components within normal limits  COMPREHENSIVE METABOLIC PANEL - Abnormal; Notable for the following components:   Glucose, Bld 108 (*)    All other components within  normal limits  LIPASE, BLOOD  OCCULT BLOOD X 1 CARD TO LAB, STOOL  POC OCCULT BLOOD, ED    EKG None  Radiology CT ABDOMEN PELVIS W CONTRAST Result Date: 07/14/2023 CLINICAL DATA:  Left upper quadrant pain for several days. Hematochezia. EXAM: CT ABDOMEN AND PELVIS WITH CONTRAST TECHNIQUE: Multidetector CT imaging of the abdomen and pelvis was performed using the standard protocol following bolus administration of intravenous contrast. RADIATION DOSE REDUCTION: This exam was performed according to the departmental dose-optimization program which includes automated exposure control, adjustment of the mA and/or kV according to patient size and/or use of iterative reconstruction technique. CONTRAST:  OMNIPAQUE IOHEXOL 300 MG/ML  SOLN COMPARISON:  None Available. FINDINGS: Lower Chest: No acute findings. Hepatobiliary: Moderate diffuse hepatic steatosis. A 1.5 cm hypervascular lesion is seen in the posterior right hepatic lobe. No other liver lesions identified. Gallbladder is unremarkable. No evidence of biliary ductal dilatation. Pancreas:  No mass or inflammatory changes. Spleen: Within normal limits in size and appearance. Adrenals/Urinary Tract: No suspicious masses identified. No evidence of ureteral calculi or hydronephrosis. Unremarkable unopacified urinary bladder. Stomach/Bowel: No evidence of obstruction, inflammatory process or abnormal fluid collections. Normal appendix visualized. Vascular/Lymphatic: No pathologically enlarged lymph nodes. No acute vascular findings. Reproductive:  No mass or other significant abnormality. Other:  None. Musculoskeletal:  No suspicious bone lesions identified. IMPRESSION: No acute findings. Moderate hepatic steatosis. 1.5 cm hypervascular lesion in posterior right hepatic lobe, which is nonspecific. Most likely differential diagnoses include hemangioma, focal nodular hyperplasia, and adenoma. Recommend nonemergent outpatient abdomen MRI without and with  contrast for further characterization. Electronically Signed   By: Danae Orleans M.D.   On: 07/14/2023 17:53    Procedures Procedures    Medications Ordered in ED Medications  iohexol (OMNIPAQUE) 300 MG/ML solution 100 mL (100 mLs Intravenous Contrast Given 07/14/23 1559)    ED Course/ Medical Decision Making/ A&P                                 Medical Decision Making Amount and/or Complexity of Data Reviewed Labs: ordered. Radiology: ordered.  Risk Prescription drug management.   BP (!) 119/90 (BP Location: Left Arm)   Pulse 79   Temp 98.1 F (36.7 C) (Oral)   Resp 18   Ht 5\' 9"  (1.753 m)   Wt 77.1 kg   SpO2 97%   BMI 25.10 kg/m   25:7 PM  48 year old male history of prediabetes, chronic back pain, hyperlipidemia, thyroid disease presenting with complaint of abdominal pain.  Patient states for the past several weeks he has had recurrent abdominal discomfort.  He described pain as a crampy and caudal sensation  that started in his left upper abdomen that sometimes goes across his abdomen.  Pain happens sporadically.  He also noticed intermittent episodes of rectal bleeding.  States initially he noticed darker blood but for the past few times he noticed bright red blood per rectum.  He endorsed some diarrhea but no constipation denies straining denies any rectal discomfort no fever or chills no chest pain or shortness of breath.  Denies heavy alcohol use and denies regular NSAID use.  No postprandial pain.  He is not on any blood thinner medication.  He mention he had a colonoscopy 2 years ago with multiple polyps but was benign.  Endorse family history of colon cancer.  On exam, patient is resting comfortably appears to be in no acute discomfort.  Very mild left upper quadrant tenderness no guarding or rebound tenderness.  No CVA tenderness.  -Labs ordered, independently viewed and interpreted by me.  Labs remarkable for hemoccult negative, labs are reassuring -The patient was  maintained on a cardiac monitor.  I personally viewed and interpreted the cardiac monitored which showed an underlying rhythm of: NSR -Imaging independently viewed and interpreted by me and I agree with radiologist's interpretation.  Result remarkable for abd/pelvis CT showing no acute finding.  Pt does have 1.5cm lesion in the liver.  Recommend nonemergent abd MRI w/wout CM.  I discussed this with pt -This patient presents to the ED for concern of abd pain, this involves an extensive number of treatment options, and is a complaint that carries with it a high risk of complications and morbidity.  The differential diagnosis includes cholecystitis, pancreatitis, gastritis, GERD, appendicitis, diverticular disease, UTI, kidney stone -Co morbidities that complicate the patient evaluation includes prediabetes, hyperlipidemia, thyroid disease, chronic back pain -Treatment includes supportive care -Reevaluation of the patient after these medicines showed that the patient improved -PCP office notes or outside notes reviewed -Escalation to admission/observation considered: patients feels much better, is comfortable with discharge, and will follow up with PCP -Prescription medication considered, patient comfortable with otc meds -Social Determinant of Health considered which includes food insecurity, financial difficulty         Final Clinical Impression(s) / ED Diagnoses Final diagnoses:  Gastrointestinal hemorrhage, unspecified gastrointestinal hemorrhage type  Abnormal abdominal CT scan    Rx / DC Orders ED Discharge Orders     None         Fayrene Helper, PA-C 07/14/23 1915    Tegeler, Canary Brim, MD 07/15/23 [phone]

## 2023-07-15 NOTE — Telephone Encounter (Signed)
 Copied from CRM [id number]. Topic: General - Other >> Jul 15, 2023  3:23 PM Deaijah H wrote: Reason for CRM: Patient called in and stated he had an Hospital visit due to blood in stool and upper abdominal pain. Hospital advised MRI is needed, also stated his CT scan results are located in chart for Dr. Elease Etienne to view. Did try to set up hospital follow up visit but patient stated he cannot wait. Please call [phone]

## 2023-07-16 NOTE — Addendum Note (Signed)
 Addended by: Michaela Corner on: 07/16/2023 03:33 PM   Modules accepted: Orders

## 2023-08-07 DIAGNOSIS — Z419 Encounter for procedure for purposes other than remedying health state, unspecified: Secondary | ICD-10-CM | POA: Diagnosis not present

## 2023-08-26 ENCOUNTER — Other Ambulatory Visit

## 2023-08-26 ENCOUNTER — Ambulatory Visit
Admission: RE | Admit: 2023-08-26 | Discharge: 2023-08-26 | Disposition: A | Discharge status: Home / Self Care | Source: Ambulatory Visit | Attending: Nurse Practitioner | Admitting: Nurse Practitioner

## 2023-08-26 DIAGNOSIS — K769 Liver disease, unspecified: Secondary | ICD-10-CM

## 2023-08-26 DIAGNOSIS — K7689 Other specified diseases of liver: Secondary | ICD-10-CM | POA: Diagnosis not present

## 2023-08-26 MED ORDER — GADOPICLENOL 0.5 MMOL/ML IV SOLN
8.0000 mL | Freq: Once | INTRAVENOUS | Status: AC | PRN
  Administered 2023-08-26: 8 mL via INTRAVENOUS

## 2023-08-28 ENCOUNTER — Encounter: Payer: Self-pay | Admitting: Nurse Practitioner

## 2023-09-06 DIAGNOSIS — Z419 Encounter for procedure for purposes other than remedying health state, unspecified: Secondary | ICD-10-CM | POA: Diagnosis not present

## 2023-10-07 DIAGNOSIS — Z419 Encounter for procedure for purposes other than remedying health state, unspecified: Secondary | ICD-10-CM | POA: Diagnosis not present

## 2023-11-06 DIAGNOSIS — Z419 Encounter for procedure for purposes other than remedying health state, unspecified: Secondary | ICD-10-CM | POA: Diagnosis not present

## 2023-12-07 DIAGNOSIS — Z419 Encounter for procedure for purposes other than remedying health state, unspecified: Secondary | ICD-10-CM | POA: Diagnosis not present

## 2024-01-04 ENCOUNTER — Ambulatory Visit: Payer: Self-pay | Admitting: Nurse Practitioner

## 2024-01-04 ENCOUNTER — Other Ambulatory Visit (INDEPENDENT_AMBULATORY_CARE_PROVIDER_SITE_OTHER)

## 2024-01-04 DIAGNOSIS — E782 Mixed hyperlipidemia: Secondary | ICD-10-CM | POA: Diagnosis not present

## 2024-01-04 DIAGNOSIS — R7303 Prediabetes: Secondary | ICD-10-CM | POA: Diagnosis not present

## 2024-01-04 LAB — LIPID PANEL
Cholesterol: 211 mg/dL — ABNORMAL HIGH (ref 0–200)
HDL: 39.4 mg/dL (ref >39.00)
LDL Cholesterol: 133 mg/dL — ABNORMAL HIGH (ref 0–99)
NonHDL: 172.04
Total CHOL/HDL Ratio: 5
Triglycerides: 196 mg/dL — ABNORMAL HIGH (ref 0.0–149.0)
VLDL: 39.2 mg/dL (ref 0.0–40.0)

## 2024-01-04 LAB — HEMOGLOBIN A1C: Hgb A1c MFr Bld: 6.1 % (ref 4.6–6.5)

## 2024-01-07 DIAGNOSIS — Z419 Encounter for procedure for purposes other than remedying health state, unspecified: Secondary | ICD-10-CM | POA: Diagnosis not present

## 2024-04-07 DIAGNOSIS — Z419 Encounter for procedure for purposes other than remedying health state, unspecified: Secondary | ICD-10-CM | POA: Diagnosis not present

## 2024-06-08 ENCOUNTER — Encounter: Payer: Medicaid Other | Admitting: Nurse Practitioner

## 2024-06-09 ENCOUNTER — Encounter: Payer: Medicaid Other | Admitting: Nurse Practitioner
